# Patient Record
Sex: Male | Born: 1965
Health system: Southern US, Community
[De-identification: ages and names within clinical notes are randomized; demographics above are authoritative.]

## PROBLEM LIST (undated history)

## (undated) DIAGNOSIS — E785 Hyperlipidemia, unspecified: Secondary | ICD-10-CM

## (undated) DIAGNOSIS — N200 Calculus of kidney: Secondary | ICD-10-CM

## (undated) HISTORY — DX: Hyperlipidemia, unspecified: E78.5

## (undated) HISTORY — DX: Calculus of kidney: N20.0

## (undated) HISTORY — PX: CARPAL TUNNEL RELEASE: SHX101

---

## 2003-11-11 ENCOUNTER — Emergency Department (HOSPITAL_COMMUNITY): Admission: EM | Admit: 2003-11-11 | Discharge: 2003-11-12 | Payer: Self-pay | Admitting: Emergency Medicine

## 2003-11-16 ENCOUNTER — Ambulatory Visit (HOSPITAL_BASED_OUTPATIENT_CLINIC_OR_DEPARTMENT_OTHER): Admission: RE | Admit: 2003-11-16 | Discharge: 2003-11-16 | Payer: Self-pay | Admitting: Orthopedic Surgery

## 2003-11-16 ENCOUNTER — Ambulatory Visit (HOSPITAL_COMMUNITY): Admission: RE | Admit: 2003-11-16 | Discharge: 2003-11-16 | Payer: Self-pay | Admitting: Orthopedic Surgery

## 2004-07-23 ENCOUNTER — Ambulatory Visit: Payer: Self-pay | Admitting: Internal Medicine

## 2004-07-28 ENCOUNTER — Ambulatory Visit: Payer: Self-pay | Admitting: Internal Medicine

## 2004-08-06 ENCOUNTER — Ambulatory Visit: Payer: Self-pay | Admitting: Internal Medicine

## 2004-11-07 ENCOUNTER — Ambulatory Visit: Payer: Self-pay | Admitting: Internal Medicine

## 2005-02-21 ENCOUNTER — Emergency Department (HOSPITAL_COMMUNITY): Admission: EM | Admit: 2005-02-21 | Discharge: 2005-02-21 | Payer: Self-pay | Admitting: *Deleted

## 2005-02-27 ENCOUNTER — Ambulatory Visit: Payer: Self-pay | Admitting: Internal Medicine

## 2005-05-25 DIAGNOSIS — N200 Calculus of kidney: Secondary | ICD-10-CM | POA: Insufficient documentation

## 2005-05-25 HISTORY — DX: Calculus of kidney: N20.0

## 2005-09-01 ENCOUNTER — Ambulatory Visit: Payer: Self-pay | Admitting: Internal Medicine

## 2005-09-09 ENCOUNTER — Ambulatory Visit: Payer: Self-pay | Admitting: Internal Medicine

## 2006-07-28 ENCOUNTER — Ambulatory Visit: Payer: Self-pay | Admitting: Internal Medicine

## 2006-07-28 LAB — CONVERTED CEMR LAB
Bilirubin Urine: NEGATIVE
CO2: 30 meq/L (ref 19–32)
Chloride: 106 meq/L (ref 96–112)
Cholesterol: 257 mg/dL (ref 0–200)
Creatinine, Ser: 0.9 mg/dL (ref 0.4–1.5)
Glucose, Bld: 92 mg/dL (ref 70–99)
HDL: 42.3 mg/dL (ref >39.0)
Hemoglobin, Urine: NEGATIVE
Leukocytes, UA: NEGATIVE
Specific Gravity, Urine: 1.025 (ref 1.000–1.03)
Urine Glucose: NEGATIVE mg/dL
pH: 6 (ref 5.0–8.0)

## 2006-12-14 ENCOUNTER — Ambulatory Visit: Payer: Self-pay | Admitting: Internal Medicine

## 2006-12-14 LAB — CONVERTED CEMR LAB
ALT: 29 units/L (ref 0–53)
Cholesterol: 271 mg/dL (ref 0–200)
Direct LDL: 192.4 mg/dL
HDL: 39 mg/dL (ref >39.0)
Total CHOL/HDL Ratio: 6.9
Triglycerides: 72 mg/dL (ref 0–149)

## 2007-02-03 ENCOUNTER — Encounter: Payer: Self-pay | Admitting: Internal Medicine

## 2007-02-03 DIAGNOSIS — E785 Hyperlipidemia, unspecified: Secondary | ICD-10-CM | POA: Insufficient documentation

## 2007-03-15 ENCOUNTER — Encounter: Payer: Self-pay | Admitting: Internal Medicine

## 2007-03-15 ENCOUNTER — Ambulatory Visit: Payer: Self-pay | Admitting: Internal Medicine

## 2007-03-15 DIAGNOSIS — M76899 Other specified enthesopathies of unspecified lower limb, excluding foot: Secondary | ICD-10-CM

## 2007-03-15 HISTORY — DX: Other specified enthesopathies of unspecified lower limb, excluding foot: M76.899

## 2007-10-27 ENCOUNTER — Telehealth: Payer: Self-pay | Admitting: Internal Medicine

## 2007-11-03 ENCOUNTER — Ambulatory Visit: Payer: Self-pay | Admitting: Internal Medicine

## 2007-11-03 LAB — CONVERTED CEMR LAB
ALT: 39 units/L (ref 0–53)
AST: 27 units/L (ref 0–37)
Bilirubin, Direct: 0.1 mg/dL (ref 0.0–0.3)
Calcium: 8.9 mg/dL (ref 8.4–10.5)
Cholesterol: 203 mg/dL (ref 0–200)
Creatinine, Ser: 0.9 mg/dL (ref 0.4–1.5)
GFR calc Af Amer: 118 mL/min
GFR calc non Af Amer: 97 mL/min
HCT: 43.7 % (ref 39.0–52.0)
HDL: 38.4 mg/dL — ABNORMAL LOW (ref >39.0)
Hemoglobin: 15.2 g/dL (ref 13.0–17.0)
Ketones, ur: NEGATIVE mg/dL
Leukocytes, UA: NEGATIVE
MCHC: 34.7 g/dL (ref 30.0–36.0)
Monocytes Absolute: 0.7 10*3/uL (ref 0.1–1.0)
Neutro Abs: 3.3 10*3/uL (ref 1.4–7.7)
Neutrophils Relative %: 48.1 % (ref 43.0–77.0)
Nitrite: NEGATIVE
Potassium: 3.9 meq/L (ref 3.5–5.1)
RDW: 12.2 % (ref 11.5–14.6)
Sodium: 140 meq/L (ref 135–145)
Specific Gravity, Urine: 1.025 (ref 1.000–1.03)
Total Bilirubin: 0.9 mg/dL (ref 0.3–1.2)
Total Protein, Urine: NEGATIVE mg/dL
Total Protein: 7.2 g/dL (ref 6.0–8.3)
Urine Glucose: NEGATIVE mg/dL
Urobilinogen, UA: 0.2 (ref 0.0–1.0)
pH: 6 (ref 5.0–8.0)

## 2007-11-15 ENCOUNTER — Ambulatory Visit: Payer: Self-pay | Admitting: Internal Medicine

## 2008-11-21 ENCOUNTER — Ambulatory Visit: Payer: Self-pay | Admitting: Internal Medicine

## 2008-11-26 LAB — CONVERTED CEMR LAB
Alkaline Phosphatase: 55 units/L (ref 39–117)
BUN: 15 mg/dL (ref 6–23)
Basophils Absolute: 0 10*3/uL (ref 0.0–0.1)
Bilirubin Urine: NEGATIVE
Bilirubin, Direct: 0.1 mg/dL (ref 0.0–0.3)
CO2: 30 meq/L (ref 19–32)
Calcium: 9 mg/dL (ref 8.4–10.5)
Chloride: 106 meq/L (ref 96–112)
Cholesterol: 228 mg/dL — ABNORMAL HIGH (ref 0–200)
Direct LDL: 162.6 mg/dL
GFR calc non Af Amer: 110.98 mL/min (ref >60)
HCT: 43.4 % (ref 39.0–52.0)
Ketones, ur: NEGATIVE mg/dL
Leukocytes, UA: NEGATIVE
Lymphs Abs: 2.6 10*3/uL (ref 0.7–4.0)
MCV: 86.1 fL (ref 78.0–100.0)
Monocytes Absolute: 0.8 10*3/uL (ref 0.1–1.0)
Monocytes Relative: 10.9 % (ref 3.0–12.0)
Neutro Abs: 3.2 10*3/uL (ref 1.4–7.7)
Nitrite: NEGATIVE
RDW: 12.1 % (ref 11.5–14.6)
Specific Gravity, Urine: 1.025 (ref 1.000–1.030)
Total Bilirubin: 1.2 mg/dL (ref 0.3–1.2)
Total Protein, Urine: NEGATIVE mg/dL
Total Protein: 7.1 g/dL (ref 6.0–8.3)
Triglycerides: 116 mg/dL (ref 0.0–149.0)
Urine Glucose: NEGATIVE mg/dL

## 2008-12-10 ENCOUNTER — Ambulatory Visit: Payer: Self-pay | Admitting: Internal Medicine

## 2008-12-10 DIAGNOSIS — Z87448 Personal history of other diseases of urinary system: Secondary | ICD-10-CM | POA: Insufficient documentation

## 2009-01-11 ENCOUNTER — Encounter: Payer: Self-pay | Admitting: Internal Medicine

## 2009-02-21 ENCOUNTER — Encounter: Admission: RE | Admit: 2009-02-21 | Discharge: 2009-02-21 | Payer: Self-pay | Admitting: Internal Medicine

## 2009-10-25 ENCOUNTER — Ambulatory Visit: Payer: Self-pay | Admitting: Internal Medicine

## 2009-10-25 LAB — CONVERTED CEMR LAB
AST: 19 units/L (ref 0–37)
Alkaline Phosphatase: 44 units/L (ref 39–117)
Bilirubin Urine: NEGATIVE
Bilirubin, Direct: 0.1 mg/dL (ref 0.0–0.3)
Chloride: 106 meq/L (ref 96–112)
Cholesterol: 185 mg/dL (ref 0–200)
Eosinophils Relative: 4.3 % (ref 0.0–5.0)
GFR calc non Af Amer: 107.42 mL/min (ref >60)
HCT: 41.9 % (ref 39.0–52.0)
Ketones, ur: NEGATIVE mg/dL
Lymphocytes Relative: 36.3 % (ref 12.0–46.0)
Lymphs Abs: 2.7 10*3/uL (ref 0.7–4.0)
MCHC: 34.3 g/dL (ref 30.0–36.0)
MCV: 85.8 fL (ref 78.0–100.0)
Monocytes Absolute: 0.7 10*3/uL (ref 0.1–1.0)
Neutrophils Relative %: 49.1 % (ref 43.0–77.0)
Nitrite: NEGATIVE
Platelets: 234 10*3/uL (ref 150.0–400.0)
Specific Gravity, Urine: 1.025 (ref 1.000–1.030)
Total Protein, Urine: NEGATIVE mg/dL
VLDL: 22 mg/dL (ref 0.0–40.0)
WBC: 7.4 10*3/uL (ref 4.5–10.5)
pH: 6 (ref 5.0–8.0)

## 2009-10-30 ENCOUNTER — Ambulatory Visit: Payer: Self-pay | Admitting: Internal Medicine

## 2009-10-30 DIAGNOSIS — N410 Acute prostatitis: Secondary | ICD-10-CM

## 2009-10-30 HISTORY — DX: Acute prostatitis: N41.0

## 2009-11-19 ENCOUNTER — Telehealth: Payer: Self-pay | Admitting: Internal Medicine

## 2010-05-28 ENCOUNTER — Telehealth: Payer: Self-pay | Admitting: Internal Medicine

## 2010-06-24 NOTE — Progress Notes (Signed)
Summary: Plot pt  Phone Note Call from Patient Call back at [facility] Phone [phone]   Summary of Call: Pt completed meds given at last office visit. He feels somewhat better but continues to have symptoms. Please adivse.  Initial call taken by: Lamar Sprinkles, CMA,  November 19, 2009 5:34 PM  Follow-up for Phone Call        get him to come in for a recheck Follow-up by: Etta Grandchild MD,  November 20, 2009 7:30 AM     Appended Document: Plot pt left detailed vm on hm # for pt to contact office for f/u

## 2010-06-24 NOTE — Assessment & Plan Note (Signed)
Summary: CPX/ /NWS  #   Vital Signs:  Patient profile:   45 year old male Height:      71 inches Weight:      210 pounds BMI:     29.39 O2 Sat:      98 % on Room air Temp:     98.1 degrees F oral Pulse rate:   72 / minute BP sitting:   112 / 70  (left arm) Cuff size:   regular  Vitals Entered By: Lucious Groves (October 30, 2009 10:45 AM)  O2 Flow:  Room air CC: CPX./kb Is Patient Diabetic? No Pain Assessment Patient in pain? no        CC:  CPX./kb.  History of Present Illness: The patient presents for a wellness examination  C/o perineal pain, chills, urgency, fatigue x 2 wks   Current Medications (verified): 1)  Vitamin D3 1000 Unit  Tabs (Cholecalciferol) .Marland Kitchen.. 1 By Mouth Daily 2)  Lipitor 40 Mg Tabs (Atorvastatin Calcium) .Marland Kitchen.. 1 By Mouth Qd  Allergies (verified): 1)  ! Simvastatin (Simvastatin)  Past History:  Past Medical History: Last updated: 12/10/2008 DYSLIPIDEMIA (ICD-272.4) Kidney stones 2007, h/o urol. consult Microhematuria  Social History: Last updated: 11/15/2007 Never Smoked Alcohol use - a little Occupation: Married Regular exercise-yes  Review of Systems       The patient complains of fever.  The patient denies anorexia, weight loss, weight gain, vision loss, decreased hearing, hoarseness, chest pain, syncope, dyspnea on exertion, peripheral edema, prolonged cough, headaches, hemoptysis, abdominal pain, melena, hematochezia, severe indigestion/heartburn, hematuria, incontinence, genital sores, muscle weakness, suspicious skin lesions, transient blindness, difficulty walking, depression, unusual weight change, abnormal bleeding, enlarged lymph nodes, angioedema, and testicular masses.    Physical Exam  General:  Well-developed,well-nourished,in no acute distress; alert,appropriate and cooperative throughout examination Head:  Normocephalic and atraumatic without obvious abnormalities. No apparent alopecia or balding. Eyes:  No corneal or  conjunctival inflammation noted. EOMI. Perrla. Ears:  External ear exam shows no significant lesions or deformities.  Otoscopic examination reveals clear canals, tympanic membranes are intact bilaterally without bulging, retraction, inflammation or discharge. Hearing is grossly normal bilaterally. Nose:  External nasal examination shows no deformity or inflammation. Nasal mucosa are pink and moist without lesions or exudates. Mouth:  Oral mucosa and oropharynx without lesions or exudates.  Teeth in good repair. Neck:  No deformities, masses, or tenderness noted. Lungs:  Normal respiratory effort, chest expands symmetrically. Lungs are clear to auscultation, no crackles or wheezes. Heart:  Normal rate and regular rhythm. S1 and S2 normal without gallop, murmur, click, rub or other extra sounds. Abdomen:  Bowel sounds positive,abdomen soft and non-tender without masses, organomegaly or hernias noted. Rectal:  No external abnormalities noted. Normal sphincter tone. No rectal masses or tenderness. Genitalia:  Testes bilaterally descended without nodularity, tenderness or masses. No scrotal masses or lesions. No penis lesions or urethral discharge. Prostate:  Prostate gland firm and smooth, no enlargement, nodularity, tenderness, mass, asymmetry or induration. Msk:  No deformity or scoliosis noted of thoracic or lumbar spine.   Pulses:  R and L carotid,radial,femoral,dorsalis pedis and posterior tibial pulses are full and equal bilaterally Extremities:  No clubbing, cyanosis, edema, or deformity noted with normal full range of motion of all joints.   Neurologic:  No cranial nerve deficits noted. Station and gait are normal. Plantar reflexes are down-going bilaterally. DTRs are symmetrical throughout. Sensory, motor and coordinative functions appear intact. Skin:  Intact without suspicious lesions or rashes Cervical Nodes:  No lymphadenopathy noted Inguinal Nodes:  No significant adenopathy Psych:   Cognition and judgment appear intact. Alert and cooperative with normal attention span and concentration. No apparent delusions, illusions, hallucinations   Impression & Recommendations:  Problem # 1:  WELL ADULT EXAM (ICD-V70.0) Assessment New The labs were reviewed with the patient.  Health and age related issues were discussed. Available screening tests and vaccinations were discussed as well. Healthy life style including good diet and execise was discussed.  Orders: EKG w/ Interpretation (93000)  Problem # 2:  PROSTATITIS, ACUTE (ICD-601.0) Assessment: New Cipro x 2 wks  Problem # 3:  DYSLIPIDEMIA (ICD-272.4) Assessment: Improved  His updated medication list for this problem includes:    Lipitor 40 Mg Tabs (Atorvastatin calcium) .Marland Kitchen... 1 by mouth qd  Complete Medication List: 1)  Vitamin D3 1000 Unit Tabs (Cholecalciferol) .Marland Kitchen.. 1 by mouth daily 2)  Lipitor 40 Mg Tabs (Atorvastatin calcium) .Marland Kitchen.. 1 by mouth qd 3)  Ciprofloxacin Hcl 500 Mg Tabs (Ciprofloxacin hcl) .Marland Kitchen.. 1 by mouth bid 4)  Naproxen 500 Mg Tabs (Naproxen) .Marland Kitchen.. 1 by mouth two times a day pc x 1 wk then prn  Patient Instructions: 1)  Please schedule a follow-up appointment in 6 months. 2)  BMP prior to visit, ICD-9: 3)  Lipid Panel prior to visit, ICD-9:272.20 4)  Urine Microalbumin prior to visit, ICD-9: 5)  Rapaflo if "stopped up" as needed for  a  car trip to [place] Prescriptions: LIPITOR 40 MG TABS (ATORVASTATIN CALCIUM) 1 by mouth qd  #30 x 12   Entered and Authorized by:   Tresa Garter MD   Signed by:   Tresa Garter MD on 10/30/2009   Method used:   Electronically to        [facility] [phone]* (retail)       [address]       [place], [place]  [address]       Ph: [phone]       Fax: [phone]   RxID:   [id number] NAPROXEN 500 MG TABS (NAPROXEN) 1 by mouth two times a day pc x 1 wk then prn  #60 x 1   Entered and Authorized by:   Tresa Garter MD   Signed by:   Tresa Garter MD on 10/30/2009   Method used:   Electronically to        [facility]. [phone]* (retail)       [address]       [place], [place]  [address]       Ph: [phone]       Fax: [phone]   RxID:   [id number] CIPROFLOXACIN HCL 500 MG TABS (CIPROFLOXACIN HCL) 1 by mouth bid  #28 x 1   Entered and Authorized by:   Tresa Garter MD   Signed by:   Tresa Garter MD on 10/30/2009   Method used:   Electronically to        [facility]. [phone]* (retail)       [address]       [place], [place]  [address]       Ph: [phone]       Fax: [phone]   RxID:   [id number]

## 2010-06-26 NOTE — Progress Notes (Signed)
Summary: LIPITOR NOT COVERED  Phone Note Refill Request   Refills Requested: Medication #1:  LIPITOR 40 MG TABS 1 by mouth qd Pts insurance will not pay for lipitor, please advise another alternative.   Initial call taken by: Ami Bullins CMA,  May 28, 2010 3:55 PM  Follow-up for Phone Call        Lipitor is generic now. What is covered? Follow-up by: Tresa Garter MD,  May 29, 2010 12:04 AM  Additional Follow-up for Phone Call Additional follow up Details #1::        Per pharmacy pt last picked up Rx in June 2011 and paid $162. Lipitor is now generic and with Insurance coverage it is $102. I called pt to get more details, no ans no VM. Margaret Pyle, CMA  May 30, 2010 11:03 AM     Additional Follow-up for Phone Call Additional follow up Details #2::    Pt called this am and left message on triage A and would like a call today regarding above message -- [phone] or [phone] Follow-up by: Verdell Face,  June 02, 2010 9:47 AM  Additional Follow-up for Phone Call Additional follow up Details #3:: Details for Additional Follow-up Action Taken: Left detailed vm for pt, need to know what plan covers. Pt has been on simvastatin in past but it is on allergy list..............Marland KitchenLamar Sprinkles, CMA  June 03, 2010 6:14 PM

## 2010-10-10 NOTE — Op Note (Signed)
Adrian Cruz, Adrian Cruz                      ACCOUNT NO.:  [id number]   MEDICAL RECORD NO.:  [id number]                   PATIENT TYPE:  AMB   LOCATION:  [facility]                                  FACILITY:  [facility]   PHYSICIAN:  Katy Fitch. Naaman Plummer., M.D.          DATE OF BIRTH:  Sep 25, 1965   DATE OF PROCEDURE:  11/16/2003  DATE OF DISCHARGE:  11/16/2003                                 OPERATIVE REPORT   PREOPERATIVE DIAGNOSIS:  Comminuted intraarticular fracture right distal  radius with impaction, dorsal angulation and loss or radial sloop and  length.   POSTOPERATIVE DIAGNOSIS:  Comminuted intraarticular fracture right distal  radius with impaction, dorsal angulation and loss or radial sloop and  length.   OPERATION:  Open reduction, internal fixation with a 9 peg/ screw, DVR plate  system.   OPERATING SURGEON:  Katy Fitch. Sypher, M.D.   ASSISTANT:  Marveen Reeks. Dasnoit, P.A.-C.   ANESTHESIA:  General by LMA.   SUPERVISING ANESTHESIOLOGIST:  Judie Petit, M.D.   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   On November 11, 2003, he fell in his garage sustaining a severe injury to his  right wrist.  He was seen at an Urgent Care Facility where x-rays revealed a  comminuted intraarticular fracture of the right wrist. He was splinted and  referred for upper-extremity orthopedic evaluation and management.  Clinical  examination in the office revealed evidence of a severely-comminuted,  intraarticular fracture of the right distal radius.  It was impacted, with  loss of radial volar tilt, length and slope.   We recommended open reduction, internal fixation at this time, anticipating  placement of a volar plate system.   After informed consent, Adrian Cruz was brought to the operating room at  this time.   DESCRIPTION OF PROCEDURE:  Adrian Cruz is brought to the operating  room and placed  in supine position on the operating table.  Following  induction of general anesthesia by LMA, the right arm was prepped with  Betadine solution, and sterilely draped.  Ancef 1 gram was administered as  an IV prophylactic antibiotic.  The procedure commenced with exsanguination  of the right arm with an Esmarch bandage and inflation of arterial target on  the proximal brachium to 250 mmHg, and later 270 mmHg due to mild systolic  hypertension.   The procedure commenced with a standard DVR curvilinear incision paralleling  the flexor carpi radialis.  Subcutaneous tissue was carefully divided and  carried taking care to identify and cauterize transverse veins.  A large-  caliber vein crossing the incision was suture-ligated with 4-0 Vicryl.   The fascia overlying the flexor carpi radialis was released, followed by  release of the fascia at the floor of the flexor carpi radialis sheath.  The  flexor pollicis longus was retracted in an ulnar direction, and the radial  artery and its  accompanying veins in a radial direction.  The pronator  quadratus was carefully elevated off the distal radius, preserving its  origin on the ulna.   The fracture site was identified and found to be significantly impacted and  comminuted.  With the aid of  Kleinert-Kutz elevator, the fracture fragments  were repositioned anatomically.  The volar cortex was anatomically  assembled, followed by application of a 9 Peg DVR plate system.  Multiple  screws were used in the proximal row to secure the metaphysis of the distal  fragments.  Pegs were used on the radial and ulnar surfaces, and in  positions likely to possibly encounter a tendon on the dorsal cortex.  Care  was taken to drill the most distal pegs to the dorsal cortex but not through  the distal cortex.   The wound was then thoroughly debouched with sterile saline, and AP lateral  and DVR oblique images obtained documenting anatomic reduction of the  radius  and excellent position of the hardware.   The wound was then lavaged with sterile saline, followed by repair of the  pronator quadratus to cover the plate, particularly distally.  Mattress  sutures of 0-Vicryl were utilized to secure the pronator quadratus over the  distal two-thirds of the plate.  The skin was then repaired with subdermal  sutures of 3-0 Vicryl and intradermal 3-0 Prolene with Steri-Strips.  There  were no apparent complications.  Mr.  Cruz tolerated the surgery and  anesthesia well.  He was discharged to the recovery room with stable vital  signs.   NOTE FOR AFTER CARE:  He was given prescription for:  1. Dilaudid 2 mg, one to two tablets, p.o. q.4-6h. p.r.n. pain, #30 tablets     without refill.  2. Motrin 600 mg, one p.o. q.6h p.r.n. pain, #30 tablets without refill.  3. Keflex 500 mg, one p.o. q.8h. x4 days as a prophylactic antibiotic.                                               Katy Fitch Naaman Plummer., M.D.    RVS/MEDQ  D:  11/16/2003  T:  11/18/2003  Job:  [id number]

## 2010-12-30 ENCOUNTER — Other Ambulatory Visit: Payer: Self-pay | Admitting: Internal Medicine

## 2010-12-30 ENCOUNTER — Other Ambulatory Visit (INDEPENDENT_AMBULATORY_CARE_PROVIDER_SITE_OTHER): Payer: Self-pay

## 2010-12-30 DIAGNOSIS — Z0000 Encounter for general adult medical examination without abnormal findings: Secondary | ICD-10-CM

## 2010-12-30 LAB — CBC WITH DIFFERENTIAL/PLATELET
Basophils Relative: 0.6 % (ref 0.0–3.0)
Eosinophils Relative: 7.3 % — ABNORMAL HIGH (ref 0.0–5.0)
HCT: 43.3 % (ref 39.0–52.0)
Hemoglobin: 14.4 g/dL (ref 13.0–17.0)
Lymphocytes Relative: 43.7 % (ref 12.0–46.0)
MCHC: 33.4 g/dL (ref 30.0–36.0)
Neutrophils Relative %: 38.5 % — ABNORMAL LOW (ref 43.0–77.0)
RBC: 5.01 Mil/uL (ref 4.22–5.81)

## 2010-12-30 LAB — LIPID PANEL
Cholesterol: 228 mg/dL — ABNORMAL HIGH (ref 0–200)
HDL: 43.8 mg/dL (ref >39.00)
Triglycerides: 167 mg/dL — ABNORMAL HIGH (ref 0.0–149.0)

## 2010-12-30 LAB — URINALYSIS
Bilirubin Urine: NEGATIVE
Hgb urine dipstick: NEGATIVE
Leukocytes, UA: NEGATIVE
Nitrite: NEGATIVE
Total Protein, Urine: NEGATIVE

## 2010-12-30 LAB — HEPATIC FUNCTION PANEL
Alkaline Phosphatase: 48 U/L (ref 39–117)
Bilirubin, Direct: 0.1 mg/dL (ref 0.0–0.3)

## 2010-12-30 LAB — BASIC METABOLIC PANEL
CO2: 28 mEq/L (ref 19–32)
Calcium: 9 mg/dL (ref 8.4–10.5)
Creatinine, Ser: 0.8 mg/dL (ref 0.4–1.5)
GFR: 113.22 mL/min (ref >60.00)
Glucose, Bld: 97 mg/dL (ref 70–99)

## 2010-12-30 LAB — LDL CHOLESTEROL, DIRECT: Direct LDL: 141 mg/dL

## 2010-12-31 ENCOUNTER — Other Ambulatory Visit: Payer: Self-pay | Admitting: Internal Medicine

## 2010-12-31 DIAGNOSIS — Z0000 Encounter for general adult medical examination without abnormal findings: Secondary | ICD-10-CM

## 2011-01-06 ENCOUNTER — Encounter: Payer: Self-pay | Admitting: Internal Medicine

## 2011-01-06 ENCOUNTER — Ambulatory Visit (INDEPENDENT_AMBULATORY_CARE_PROVIDER_SITE_OTHER): Payer: BC Managed Care – PPO | Admitting: Internal Medicine

## 2011-01-06 VITALS — BP 110/70 | HR 80 | Temp 98.4°F | Resp 16 | Ht 71.0 in | Wt 208.0 lb

## 2011-01-06 DIAGNOSIS — Z0000 Encounter for general adult medical examination without abnormal findings: Secondary | ICD-10-CM

## 2011-01-06 DIAGNOSIS — E785 Hyperlipidemia, unspecified: Secondary | ICD-10-CM

## 2011-01-06 HISTORY — DX: Encounter for general adult medical examination without abnormal findings: Z00.00

## 2011-01-06 MED ORDER — ATORVASTATIN CALCIUM 40 MG PO TABS: 40.0000 mg | ORAL_TABLET | Freq: Every day | ORAL | Status: DC

## 2011-01-06 NOTE — Patient Instructions (Signed)
tDAP next year

## 2011-01-06 NOTE — Assessment & Plan Note (Signed)
On Rx - does not want to change dose

## 2011-01-06 NOTE — Progress Notes (Signed)
  Subjective:    Patient ID: Adrian Cruz, male    DOB: Jan 17, 1966, 45 y.o.   MRN: [id number]  HPI  The patient is here for a wellness exam. The patient has been doing well overall without major physical or psychological issues going on lately.  The patient needs to address to address chronic  hyperlipidemia controlled with medicines as well;controlled with medical treatment and diet.   Review of Systems  Constitutional: Negative for appetite change, fatigue and unexpected weight change.  HENT: Negative for nosebleeds, congestion, sore throat, sneezing, trouble swallowing and neck pain.   Eyes: Negative for itching and visual disturbance.  Respiratory: Negative for cough.   Cardiovascular: Negative for chest pain, palpitations and leg swelling.  Gastrointestinal: Negative for nausea, diarrhea, blood in stool and abdominal distention.  Genitourinary: Negative for frequency and hematuria.  Musculoskeletal: Negative for back pain, joint swelling and gait problem.  Skin: Negative for rash.  Neurological: Negative for dizziness, tremors, speech difficulty and weakness.  Psychiatric/Behavioral: Negative for sleep disturbance, dysphoric mood and agitation. The patient is not nervous/anxious.    Wt Readings from Last 3 Encounters:  01/06/11 208 lb (94.348 kg)  10/30/09 210 lb (95.255 kg)  12/10/08 209 lb (94.802 kg)        Objective:   Physical Exam  Constitutional: He is oriented to person, place, and time. He appears well-developed and well-nourished. No distress.  HENT:  Head: Normocephalic and atraumatic.  Right Ear: External ear normal.  Left Ear: External ear normal.  Nose: Nose normal.  Mouth/Throat: Oropharynx is clear and moist. No oropharyngeal exudate.  Eyes: Conjunctivae and EOM are normal. Pupils are equal, round, and reactive to light. Right eye exhibits no discharge. Left eye exhibits no discharge. No scleral icterus.  Neck: Normal range of motion. Neck supple. No  JVD present. No tracheal deviation present. No thyromegaly present.  Cardiovascular: Normal rate, regular rhythm, normal heart sounds and intact distal pulses.  Exam reveals no gallop and no friction rub.   No murmur heard. Pulmonary/Chest: Effort normal and breath sounds normal. No stridor. No respiratory distress. He has no wheezes. He has no rales. He exhibits no tenderness.  Abdominal: Soft. Bowel sounds are normal. He exhibits no distension and no mass. There is no tenderness. There is no rebound and no guarding.  Genitourinary: Penis normal.  Musculoskeletal: Normal range of motion. He exhibits no edema and no tenderness.  Lymphadenopathy:    He has no cervical adenopathy.  Neurological: He is alert and oriented to person, place, and time. He has normal reflexes. No cranial nerve deficit. He exhibits normal muscle tone. Coordination normal.  Skin: Skin is warm and dry. No rash noted. He is not diaphoretic. No erythema. No pallor.  Psychiatric: He has a normal mood and affect. His behavior is normal. Judgment and thought content normal.    Lab Results  Component Value Date   WBC 8.0 12/30/2010   HGB 14.4 12/30/2010   HCT 43.3 12/30/2010   PLT 279.0 12/30/2010   CHOL 228* 12/30/2010   TRIG 167.0* 12/30/2010   HDL 43.80 12/30/2010   LDLDIRECT 141.0 12/30/2010   ALT 33 12/30/2010   AST 24 12/30/2010   NA 141 12/30/2010   K 4.4 12/30/2010   CL 105 12/30/2010   CREATININE 0.8 12/30/2010   BUN 13 12/30/2010   CO2 28 12/30/2010   TSH 3.82 12/30/2010   PSA 0.85 10/25/2009         Assessment & Plan:

## 2011-05-06 ENCOUNTER — Telehealth: Payer: Self-pay | Admitting: Internal Medicine

## 2011-05-06 NOTE — Telephone Encounter (Signed)
The pt called and stated he was having muscle pain and joint pain which he believes is coming from the Lipitor.  The pt is hoping for some advice on this issue.   Thanks!

## 2011-05-06 NOTE — Telephone Encounter (Signed)
Stop Lipitor When the pain is gone - re-try Lipitor (in 3-4 wks) - stop if pain relapsed Advil prn Vit D 100 iu/d Thx

## 2011-05-07 NOTE — Telephone Encounter (Signed)
Left mess for patient to call back.  

## 2011-05-08 NOTE — Telephone Encounter (Signed)
Pt informed

## 2011-10-05 ENCOUNTER — Telehealth: Payer: Self-pay | Admitting: *Deleted

## 2011-10-05 DIAGNOSIS — Z125 Encounter for screening for malignant neoplasm of prostate: Secondary | ICD-10-CM

## 2011-10-05 DIAGNOSIS — Z0000 Encounter for general adult medical examination without abnormal findings: Secondary | ICD-10-CM

## 2011-10-05 NOTE — Telephone Encounter (Signed)
Message copied by Merrilyn Puma on Mon Oct 05, 2011  8:44 AM ------      Message from: COUSIN, SHARON T      Created: Tue Sep 15, 2011  4:53 PM      Regarding: PHY DATE:  01/11/12       THANKS

## 2011-10-05 NOTE — Telephone Encounter (Signed)
01/11/12 CPE labs entered.

## 2011-12-11 ENCOUNTER — Encounter: Payer: Self-pay | Admitting: Internal Medicine

## 2011-12-11 ENCOUNTER — Ambulatory Visit (INDEPENDENT_AMBULATORY_CARE_PROVIDER_SITE_OTHER): Payer: BC Managed Care – PPO | Admitting: Internal Medicine

## 2011-12-11 ENCOUNTER — Telehealth: Payer: Self-pay | Admitting: Internal Medicine

## 2011-12-11 VITALS — BP 130/88 | HR 84 | Temp 98.3°F | Resp 16 | Wt 208.0 lb

## 2011-12-11 DIAGNOSIS — N410 Acute prostatitis: Secondary | ICD-10-CM

## 2011-12-11 DIAGNOSIS — M791 Myalgia, unspecified site: Secondary | ICD-10-CM

## 2011-12-11 DIAGNOSIS — E785 Hyperlipidemia, unspecified: Secondary | ICD-10-CM

## 2011-12-11 DIAGNOSIS — IMO0001 Reserved for inherently not codable concepts without codable children: Secondary | ICD-10-CM

## 2011-12-11 HISTORY — DX: Myalgia, unspecified site: M79.10

## 2011-12-11 MED ORDER — CIPROFLOXACIN HCL 500 MG PO TABS: 500.0000 mg | ORAL_TABLET | Freq: Two times a day (BID) | ORAL | Status: AC

## 2011-12-11 NOTE — Assessment & Plan Note (Signed)
Start cipro x 2 wks

## 2011-12-11 NOTE — Telephone Encounter (Signed)
Work in at [employer]

## 2011-12-11 NOTE — Telephone Encounter (Signed)
Caller: Adrian Cruz/Patient; PCP: Sonda Primes; CB#: [phone]; Call regarding Prostate Problems; sx started 1 week ago; approx 12/04/11;   urine flow is slow and having pain constantly in the both  testicles; Triaged per Urinary Symptoms-Male Guideline; See in 24 hr d/t urinary tract symptoms and scrotal pain; denies swelling of testicles;  pt is refusing to see any other physician besides Dr Posey Rea; office called and instructed to send message and will have to get approval from MD to work in; explained to pt that message will be sent to MD and will call pt back with further instructions; will comply; OFFICE PELASE REVIEW AND CALL PT BACK ON CELL NUMBER [phone]

## 2011-12-11 NOTE — Progress Notes (Signed)
Subjective:    Patient ID: Adrian Cruz, male    DOB: 10-10-1965, 46 y.o.   MRN: [id number]  HPI  C/o prostate pain, testic pain and urinary difficulties x 3 wks, like in 2011  The patient needs to address to address chronic  hyperlipidemia - he stopped Lipitor due to muscle pains  Review of Systems  Constitutional: Negative for appetite change, fatigue and unexpected weight change.  HENT: Negative for nosebleeds, congestion, sneezing, trouble swallowing and neck pain.   Eyes: Negative for itching and visual disturbance.  Cardiovascular: Negative for palpitations and leg swelling.  Gastrointestinal: Negative for blood in stool and abdominal distention.  Genitourinary: Positive for urgency, difficulty urinating and testicular pain. Negative for dysuria, frequency, hematuria, flank pain, decreased urine volume, discharge, genital sores and penile pain.  Musculoskeletal: Negative for back pain, joint swelling and gait problem.  Neurological: Negative for dizziness, tremors, speech difficulty and weakness.  Psychiatric/Behavioral: Negative for disturbed wake/sleep cycle, dysphoric mood and agitation. The patient is not nervous/anxious.    Wt Readings from Last 3 Encounters:  12/11/11 208 lb (94.348 kg)  01/06/11 208 lb (94.348 kg)  10/30/09 210 lb (95.255 kg)        Objective:   Physical Exam  Constitutional: He is oriented to person, place, and time. He appears well-developed and well-nourished. No distress.  HENT:  Head: Normocephalic and atraumatic.  Right Ear: External ear normal.  Left Ear: External ear normal.  Nose: Nose normal.  Mouth/Throat: Oropharynx is clear and moist. No oropharyngeal exudate.  Eyes: Conjunctivae and EOM are normal. Pupils are equal, round, and reactive to light. Right eye exhibits no discharge. Left eye exhibits no discharge. No scleral icterus.  Neck: Normal range of motion. Neck supple. No JVD present. No tracheal deviation present. No  thyromegaly present.  Cardiovascular: Normal rate, regular rhythm, normal heart sounds and intact distal pulses.  Exam reveals no gallop and no friction rub.   No murmur heard. Pulmonary/Chest: Effort normal and breath sounds normal. No stridor. No respiratory distress. He has no wheezes. He has no rales. He exhibits no tenderness.  Abdominal: Soft. Bowel sounds are normal. He exhibits no distension and no mass. There is no tenderness. There is no rebound and no guarding.  Genitourinary: Guaiac negative stool. No penile tenderness.       Prostate is sensitive; size is nl  Musculoskeletal: Normal range of motion. He exhibits no edema and no tenderness.  Lymphadenopathy:    He has no cervical adenopathy.  Neurological: He is alert and oriented to person, place, and time. He has normal reflexes. No cranial nerve deficit. He exhibits normal muscle tone. Coordination normal.  Skin: Skin is warm and dry. No rash noted. He is not diaphoretic. No erythema. No pallor.  Psychiatric: He has a normal mood and affect. His behavior is normal. Judgment and thought content normal.    Lab Results  Component Value Date   WBC 8.0 12/30/2010   HGB 14.4 12/30/2010   HCT 43.3 12/30/2010   PLT 279.0 12/30/2010   CHOL 228* 12/30/2010   TRIG 167.0* 12/30/2010   HDL 43.80 12/30/2010   LDLDIRECT 141.0 12/30/2010   ALT 33 12/30/2010   AST 24 12/30/2010   NA 141 12/30/2010   K 4.4 12/30/2010   CL 105 12/30/2010   CREATININE 0.8 12/30/2010   BUN 13 12/30/2010   CO2 28 12/30/2010   TSH 3.82 12/30/2010   PSA 0.85 10/25/2009         Assessment &  Plan:

## 2011-12-11 NOTE — Assessment & Plan Note (Signed)
Will re-check lipids May try a different Rx

## 2011-12-11 NOTE — Assessment & Plan Note (Signed)
D/c lipitor 

## 2011-12-16 ENCOUNTER — Ambulatory Visit: Payer: BC Managed Care – PPO | Admitting: Internal Medicine

## 2011-12-28 ENCOUNTER — Telehealth: Payer: Self-pay

## 2011-12-28 DIAGNOSIS — N419 Inflammatory disease of prostate, unspecified: Secondary | ICD-10-CM

## 2011-12-28 NOTE — Telephone Encounter (Signed)
Pt called stating he completed ABX course for prostatitis but he is continuing to have same sxs. Pt is requesting advisement from AVP.

## 2011-12-29 MED ORDER — SILODOSIN 8 MG PO CAPS: 8.0000 mg | ORAL_CAPSULE | Freq: Every day | ORAL | Status: DC

## 2011-12-29 MED ORDER — NAPROXEN 500 MG PO TABS: 500.0000 mg | ORAL_TABLET | Freq: Two times a day (BID) | ORAL | Status: DC

## 2011-12-29 NOTE — Telephone Encounter (Signed)
Cont Naprosyn Start Rapaflo 1 a day - emailed Urol ref Thx

## 2011-12-29 NOTE — Telephone Encounter (Signed)
Pt informed

## 2012-01-11 ENCOUNTER — Encounter: Payer: BC Managed Care – PPO | Admitting: Internal Medicine

## 2012-02-19 ENCOUNTER — Other Ambulatory Visit (INDEPENDENT_AMBULATORY_CARE_PROVIDER_SITE_OTHER): Payer: BC Managed Care – PPO

## 2012-02-19 DIAGNOSIS — Z125 Encounter for screening for malignant neoplasm of prostate: Secondary | ICD-10-CM

## 2012-02-19 DIAGNOSIS — Z0000 Encounter for general adult medical examination without abnormal findings: Secondary | ICD-10-CM

## 2012-02-19 LAB — LIPID PANEL: Total CHOL/HDL Ratio: 7

## 2012-02-19 LAB — URINALYSIS, ROUTINE W REFLEX MICROSCOPIC
Bilirubin Urine: NEGATIVE
Hgb urine dipstick: NEGATIVE
Ketones, ur: NEGATIVE
Leukocytes, UA: NEGATIVE
Nitrite: NEGATIVE
Urobilinogen, UA: 0.2 (ref 0.0–1.0)

## 2012-02-19 LAB — CBC WITH DIFFERENTIAL/PLATELET
Basophils Absolute: 0 10*3/uL (ref 0.0–0.1)
Eosinophils Relative: 5.5 % — ABNORMAL HIGH (ref 0.0–5.0)
HCT: 42.6 % (ref 39.0–52.0)
Lymphocytes Relative: 36.8 % (ref 12.0–46.0)
Lymphs Abs: 2.7 10*3/uL (ref 0.7–4.0)
MCHC: 32.8 g/dL (ref 30.0–36.0)
MCV: 86.5 fl (ref 78.0–100.0)
Monocytes Absolute: 0.7 10*3/uL (ref 0.1–1.0)
Monocytes Relative: 9.5 % (ref 3.0–12.0)
Platelets: 256 10*3/uL (ref 150.0–400.0)
RDW: 13.1 % (ref 11.5–14.6)

## 2012-02-19 LAB — HEPATIC FUNCTION PANEL
AST: 21 U/L (ref 0–37)
Alkaline Phosphatase: 48 U/L (ref 39–117)
Bilirubin, Direct: 0.1 mg/dL (ref 0.0–0.3)

## 2012-02-19 LAB — BASIC METABOLIC PANEL
BUN: 16 mg/dL (ref 6–23)
Creatinine, Ser: 0.9 mg/dL (ref 0.4–1.5)
Potassium: 4.1 mEq/L (ref 3.5–5.1)
Sodium: 140 mEq/L (ref 135–145)

## 2012-02-24 ENCOUNTER — Ambulatory Visit (INDEPENDENT_AMBULATORY_CARE_PROVIDER_SITE_OTHER): Payer: BC Managed Care – PPO | Admitting: Internal Medicine

## 2012-02-24 ENCOUNTER — Encounter: Payer: Self-pay | Admitting: Internal Medicine

## 2012-02-24 VITALS — BP 128/80 | HR 76 | Temp 97.8°F | Resp 16 | Ht 70.0 in | Wt 211.0 lb

## 2012-02-24 DIAGNOSIS — Z0000 Encounter for general adult medical examination without abnormal findings: Secondary | ICD-10-CM

## 2012-02-24 DIAGNOSIS — Z23 Encounter for immunization: Secondary | ICD-10-CM

## 2012-02-24 DIAGNOSIS — N410 Acute prostatitis: Secondary | ICD-10-CM

## 2012-02-24 MED ORDER — GABAPENTIN 100 MG PO CAPS: 100.0000 mg | ORAL_CAPSULE | Freq: Three times a day (TID) | ORAL | Status: DC | PRN

## 2012-02-24 MED ORDER — PRAVASTATIN SODIUM 20 MG PO TABS: 20.0000 mg | ORAL_TABLET | Freq: Every day | ORAL | Status: DC

## 2012-02-24 MED ORDER — ALFUZOSIN HCL ER 10 MG PO TB24: 10.0000 mg | ORAL_TABLET | Freq: Every day | ORAL | Status: DC

## 2012-02-24 MED ORDER — DOXYCYCLINE HYCLATE 100 MG PO TABS: 100.0000 mg | ORAL_TABLET | Freq: Two times a day (BID) | ORAL | Status: DC

## 2012-02-24 NOTE — Patient Instructions (Signed)
Cranberry tablets for infection prophylaxis

## 2012-02-24 NOTE — Assessment & Plan Note (Signed)
We discussed age appropriate health related issues, including available/recomended screening tests and vaccinations. We discussed a need for adhering to healthy diet and exercise. Labs/EKG were reviewed/ordered. All questions were answered.   

## 2012-02-24 NOTE — Progress Notes (Signed)
Subjective:    Patient ID: Adrian Cruz, male    DOB: 11/29/1965, 46 y.o.   MRN: [id number]  HPI  The patient is here for a wellness exam. The patient has been doing well overall without major physical or psychological issues going on lately.   C/o prostate pain, testic pain and urinary difficulties x 3 wks, like in 2011  The patient needs to address to address chronic  hyperlipidemia - he stopped Lipitor due to muscle pains  Review of Systems  Constitutional: Negative for appetite change, fatigue and unexpected weight change.  HENT: Negative for nosebleeds, congestion, sneezing, trouble swallowing and neck pain.   Eyes: Negative for itching and visual disturbance.  Cardiovascular: Negative for palpitations and leg swelling.  Gastrointestinal: Negative for blood in stool and abdominal distention.  Genitourinary: Positive for urgency, difficulty urinating and testicular pain. Negative for dysuria, frequency, hematuria, flank pain, decreased urine volume, discharge, genital sores and penile pain.  Musculoskeletal: Negative for back pain, joint swelling and gait problem.  Neurological: Negative for dizziness, tremors, speech difficulty and weakness.  Psychiatric/Behavioral: Negative for disturbed wake/sleep cycle, dysphoric mood and agitation. The patient is not nervous/anxious.    Wt Readings from Last 3 Encounters:  02/24/12 211 lb (95.709 kg)  12/11/11 208 lb (94.348 kg)  01/06/11 208 lb (94.348 kg)        Objective:   Physical Exam  Constitutional: He is oriented to person, place, and time. He appears well-developed and well-nourished. No distress.  HENT:  Head: Normocephalic and atraumatic.  Right Ear: External ear normal.  Left Ear: External ear normal.  Nose: Nose normal.  Mouth/Throat: Oropharynx is clear and moist. No oropharyngeal exudate.  Eyes: Conjunctivae normal and EOM are normal. Pupils are equal, round, and reactive to light. Right eye exhibits no  discharge. Left eye exhibits no discharge. No scleral icterus.  Neck: Normal range of motion. Neck supple. No JVD present. No tracheal deviation present. No thyromegaly present.  Cardiovascular: Normal rate, regular rhythm, normal heart sounds and intact distal pulses.  Exam reveals no gallop and no friction rub.   No murmur heard. Pulmonary/Chest: Effort normal and breath sounds normal. No stridor. No respiratory distress. He has no wheezes. He has no rales. He exhibits no tenderness.  Abdominal: Soft. Bowel sounds are normal. He exhibits no distension and no mass. There is no tenderness. There is no rebound and no guarding.  Genitourinary: Guaiac negative stool. No penile tenderness.       Prostate is sensitive; size is nl  Musculoskeletal: Normal range of motion. He exhibits no edema and no tenderness.  Lymphadenopathy:    He has no cervical adenopathy.  Neurological: He is alert and oriented to person, place, and time. He has normal reflexes. No cranial nerve deficit. He exhibits normal muscle tone. Coordination normal.  Skin: Skin is warm and dry. No rash noted. He is not diaphoretic. No erythema. No pallor.  Psychiatric: He has a normal mood and affect. His behavior is normal. Judgment and thought content normal.    Lab Results  Component Value Date   WBC 7.3 02/19/2012   HGB 14.0 02/19/2012   HCT 42.6 02/19/2012   PLT 256.0 02/19/2012   CHOL 253* 02/19/2012   TRIG 110.0 02/19/2012   HDL 37.90* 02/19/2012   LDLDIRECT 178.6 02/19/2012   ALT 20 02/19/2012   AST 21 02/19/2012   NA 140 02/19/2012   K 4.1 02/19/2012   CL 109 02/19/2012   CREATININE 0.9 02/19/2012  BUN 16 02/19/2012   CO2 26 02/19/2012   TSH 1.60 02/19/2012   PSA 1.08 02/19/2012         Assessment & Plan:

## 2012-02-24 NOTE — Assessment & Plan Note (Signed)
Doxy x 1 mo if reoccurs Gabapentin prn sx's

## 2012-03-17 ENCOUNTER — Encounter: Payer: Self-pay | Admitting: Internal Medicine

## 2012-03-17 ENCOUNTER — Ambulatory Visit (INDEPENDENT_AMBULATORY_CARE_PROVIDER_SITE_OTHER): Payer: BC Managed Care – PPO | Admitting: Internal Medicine

## 2012-03-17 VITALS — BP 124/80 | HR 84 | Temp 98.0°F | Resp 16 | Wt 203.0 lb

## 2012-03-17 DIAGNOSIS — H6090 Unspecified otitis externa, unspecified ear: Secondary | ICD-10-CM

## 2012-03-17 DIAGNOSIS — H60399 Other infective otitis externa, unspecified ear: Secondary | ICD-10-CM

## 2012-03-17 HISTORY — DX: Unspecified otitis externa, unspecified ear: H60.90

## 2012-03-17 MED ORDER — NEOMYCIN-POLYMYXIN-HC 3.5-10000-1 OT SOLN: 3.0000 [drp] | Freq: Four times a day (QID) | OTIC | Status: DC

## 2012-03-17 NOTE — Assessment & Plan Note (Signed)
Cortisporin otic x 7 d

## 2012-03-17 NOTE — Progress Notes (Signed)
Patient ID: Adrian Cruz, male   DOB: 30-Sep-1965, 46 y.o.   MRN: [id number]   Subjective:    Patient ID: Adrian Cruz, male    DOB: [birth year], 46 y.o.   MRN: [id number]  Otalgia  There is pain in the right ear. This is a new problem. The current episode started yesterday. The problem occurs constantly. The pain is moderate. Pertinent negatives include no neck pain. His past medical history is significant for hearing loss.    The patient is here for a wellness exam. The patient has been doing well overall without major physical or psychological issues going on lately.   C/o prostate pain, testic pain and urinary difficulties x 3 wks, like in 2011  The patient needs to address to address chronic  hyperlipidemia - he stopped Lipitor due to muscle pains  Review of Systems  Constitutional: Negative for appetite change, fatigue and unexpected weight change.  HENT: Positive for ear pain. Negative for nosebleeds, congestion, sneezing, trouble swallowing and neck pain.   Eyes: Negative for itching and visual disturbance.  Cardiovascular: Negative for palpitations and leg swelling.  Gastrointestinal: Negative for blood in stool and abdominal distention.  Genitourinary: Negative for dysuria, urgency, frequency, hematuria, flank pain, decreased urine volume, discharge, difficulty urinating, genital sores, penile pain and testicular pain.  Musculoskeletal: Negative for back pain, joint swelling and gait problem.  Neurological: Negative for dizziness, tremors, speech difficulty and weakness.  Psychiatric/Behavioral: Negative for disturbed wake/sleep cycle, dysphoric mood and agitation. The patient is not nervous/anxious.    Wt Readings from Last 3 Encounters:  03/17/12 203 lb (92.08 kg)  02/24/12 211 lb (95.709 kg)  12/11/11 208 lb (94.348 kg)        Objective:   Physical Exam  Constitutional: He is oriented to person, place, and time. He appears well-developed and well-nourished. No  distress.  HENT:  Head: Normocephalic and atraumatic.  Right Ear: External ear normal.  Left Ear: External ear normal.  Nose: Nose normal.  Mouth/Throat: Oropharynx is clear and moist. No oropharyngeal exudate.  Eyes: Conjunctivae normal and EOM are normal. Pupils are equal, round, and reactive to light. Right eye exhibits no discharge. Left eye exhibits no discharge. No scleral icterus.  Neck: Normal range of motion. Neck supple. No JVD present. No tracheal deviation present. No thyromegaly present.  Cardiovascular: Normal rate, regular rhythm, normal heart sounds and intact distal pulses.  Exam reveals no gallop and no friction rub.   No murmur heard. Pulmonary/Chest: Effort normal and breath sounds normal. No stridor. No respiratory distress. He has no wheezes. He has no rales. He exhibits no tenderness.  Abdominal: Soft. Bowel sounds are normal. He exhibits no distension and no mass. There is no tenderness. There is no rebound and no guarding.  Genitourinary: Guaiac negative stool. No penile tenderness.       Prostate is sensitive; size is nl  Musculoskeletal: Normal range of motion. He exhibits no edema and no tenderness.  Lymphadenopathy:    He has no cervical adenopathy.  Neurological: He is alert and oriented to person, place, and time. He has normal reflexes. No cranial nerve deficit. He exhibits normal muscle tone. Coordination normal.  Skin: Skin is warm and dry. No rash noted. He is not diaphoretic. No erythema. No pallor.  Psychiatric: He has a normal mood and affect. His behavior is normal. Judgment and thought content normal.  R ear w/swelling  Lab Results  Component Value Date   WBC 7.3 02/19/2012   HGB 14.0 02/19/2012  HCT 42.6 02/19/2012   PLT 256.0 02/19/2012   CHOL 253* 02/19/2012   TRIG 110.0 02/19/2012   HDL 37.90* 02/19/2012   LDLDIRECT 178.6 02/19/2012   ALT 20 02/19/2012   AST 21 02/19/2012   NA 140 02/19/2012   K 4.1 02/19/2012   CL 109 02/19/2012   CREATININE 0.9  02/19/2012   BUN 16 02/19/2012   CO2 26 02/19/2012   TSH 1.60 02/19/2012   PSA 1.08 02/19/2012         Assessment & Plan:

## 2012-11-15 ENCOUNTER — Telehealth: Payer: Self-pay | Admitting: Internal Medicine

## 2012-11-15 DIAGNOSIS — Z0000 Encounter for general adult medical examination without abnormal findings: Secondary | ICD-10-CM

## 2012-11-15 NOTE — Telephone Encounter (Signed)
Pt has an CPE appt 11/30/12. Please put in lab work a week prior and call pt once we got the order in.

## 2012-11-16 NOTE — Telephone Encounter (Signed)
Patient notified

## 2012-11-28 ENCOUNTER — Other Ambulatory Visit: Payer: Self-pay | Admitting: *Deleted

## 2012-11-28 ENCOUNTER — Other Ambulatory Visit (INDEPENDENT_AMBULATORY_CARE_PROVIDER_SITE_OTHER): Payer: BC Managed Care – PPO

## 2012-11-28 DIAGNOSIS — Z0000 Encounter for general adult medical examination without abnormal findings: Secondary | ICD-10-CM

## 2012-11-28 LAB — COMPREHENSIVE METABOLIC PANEL
AST: 18 U/L (ref 0–37)
Albumin: 4.1 g/dL (ref 3.5–5.2)
Alkaline Phosphatase: 47 U/L (ref 39–117)
BUN: 17 mg/dL (ref 6–23)
Calcium: 9.1 mg/dL (ref 8.4–10.5)
Chloride: 106 mEq/L (ref 96–112)
Glucose, Bld: 104 mg/dL — ABNORMAL HIGH (ref 70–99)
Potassium: 4.4 mEq/L (ref 3.5–5.1)
Sodium: 139 mEq/L (ref 135–145)
Total Protein: 7.1 g/dL (ref 6.0–8.3)

## 2012-11-28 LAB — CBC WITH DIFFERENTIAL/PLATELET
Eosinophils Absolute: 0.4 10*3/uL (ref 0.0–0.7)
HCT: 44.1 % (ref 39.0–52.0)
Hemoglobin: 14.9 g/dL (ref 13.0–17.0)
Lymphs Abs: 3.4 10*3/uL (ref 0.7–4.0)
MCHC: 33.9 g/dL (ref 30.0–36.0)
Monocytes Relative: 10.8 % (ref 3.0–12.0)
Neutro Abs: 3.4 10*3/uL (ref 1.4–7.7)
RDW: 13.3 % (ref 11.5–14.6)

## 2012-11-28 LAB — URINALYSIS, ROUTINE W REFLEX MICROSCOPIC
Nitrite: NEGATIVE
Total Protein, Urine: NEGATIVE
pH: 6 (ref 5.0–8.0)

## 2012-11-28 LAB — LDL CHOLESTEROL, DIRECT: Direct LDL: 198.4 mg/dL

## 2012-11-28 LAB — LIPID PANEL: Cholesterol: 278 mg/dL — ABNORMAL HIGH (ref 0–200)

## 2012-11-28 LAB — TSH: TSH: 3.11 u[IU]/mL (ref 0.35–5.50)

## 2012-11-30 ENCOUNTER — Encounter: Payer: Self-pay | Admitting: Internal Medicine

## 2012-11-30 ENCOUNTER — Ambulatory Visit (INDEPENDENT_AMBULATORY_CARE_PROVIDER_SITE_OTHER): Payer: BC Managed Care – PPO | Admitting: Internal Medicine

## 2012-11-30 VITALS — BP 120/70 | HR 72 | Temp 98.0°F | Resp 16 | Ht 71.0 in | Wt 209.0 lb

## 2012-11-30 DIAGNOSIS — E785 Hyperlipidemia, unspecified: Secondary | ICD-10-CM

## 2012-11-30 DIAGNOSIS — N400 Enlarged prostate without lower urinary tract symptoms: Secondary | ICD-10-CM

## 2012-11-30 DIAGNOSIS — Z0000 Encounter for general adult medical examination without abnormal findings: Secondary | ICD-10-CM

## 2012-11-30 HISTORY — DX: Benign prostatic hyperplasia without lower urinary tract symptoms: N40.0

## 2012-11-30 MED ORDER — TRAMADOL HCL 50 MG PO TABS: 50.0000 mg | ORAL_TABLET | Freq: Two times a day (BID) | ORAL | Status: DC | PRN

## 2012-11-30 MED ORDER — ACYCLOVIR 400 MG PO TABS: 400.0000 mg | ORAL_TABLET | Freq: Three times a day (TID) | ORAL | Status: DC

## 2012-11-30 MED ORDER — ASPIRIN 325 MG PO TABS: 325.0000 mg | ORAL_TABLET | Freq: Every day | ORAL | Status: DC

## 2012-11-30 MED ORDER — PRAVASTATIN SODIUM 20 MG PO TABS: 20.0000 mg | ORAL_TABLET | Freq: Every day | ORAL | Status: DC

## 2012-11-30 MED ORDER — NORTRIPTYLINE HCL 10 MG PO CAPS: 10.0000 mg | ORAL_CAPSULE | Freq: Every day | ORAL | Status: DC

## 2012-11-30 NOTE — Assessment & Plan Note (Signed)
Continue with current prescription therapy as reflected on the Med list.  

## 2012-11-30 NOTE — Progress Notes (Signed)
Patient ID: Adrian Cruz, male   DOB: Oct 05, 1965, 47 y.o.   MRN: [id number]   Subjective:    Patient ID: Adrian Cruz, male    DOB: 31-Mar-1966, 47 y.o.   MRN: [id number]  HPI  The patient is here for a wellness exam. The patient has been doing well overall without major physical or psychological issues going on lately.   C/o prostate pain, testic pain and urinary difficulties x 3 wks, like in 2011  The patient needs to address to address chronic  hyperlipidemia - he stopped Lipitor due to muscle pains  Review of Systems  Constitutional: Negative for appetite change, fatigue and unexpected weight change.  HENT: Negative for nosebleeds, congestion, sneezing, trouble swallowing and neck pain.   Eyes: Negative for itching and visual disturbance.  Cardiovascular: Negative for palpitations and leg swelling.  Gastrointestinal: Negative for blood in stool and abdominal distention.  Genitourinary: Positive for urgency, difficulty urinating and testicular pain. Negative for dysuria, frequency, hematuria, flank pain, decreased urine volume, discharge, genital sores and penile pain.  Musculoskeletal: Negative for back pain, joint swelling and gait problem.  Neurological: Negative for dizziness, tremors, speech difficulty and weakness.  Psychiatric/Behavioral: Negative for sleep disturbance, dysphoric mood and agitation. The patient is not nervous/anxious.    Wt Readings from Last 3 Encounters:  11/30/12 209 lb (94.802 kg)  03/17/12 203 lb (92.08 kg)  02/24/12 211 lb (95.709 kg)        Objective:   Physical Exam  Constitutional: He is oriented to person, place, and time. He appears well-developed and well-nourished. No distress.  HENT:  Head: Normocephalic and atraumatic.  Right Ear: External ear normal.  Left Ear: External ear normal.  Nose: Nose normal.  Mouth/Throat: Oropharynx is clear and moist. No oropharyngeal exudate.  Eyes: Conjunctivae and EOM are normal. Pupils are  equal, round, and reactive to light. Right eye exhibits no discharge. Left eye exhibits no discharge. No scleral icterus.  Neck: Normal range of motion. Neck supple. No JVD present. No tracheal deviation present. No thyromegaly present.  Cardiovascular: Normal rate, regular rhythm, normal heart sounds and intact distal pulses.  Exam reveals no gallop and no friction rub.   No murmur heard. Pulmonary/Chest: Effort normal and breath sounds normal. No stridor. No respiratory distress. He has no wheezes. He has no rales. He exhibits no tenderness.  Abdominal: Soft. Bowel sounds are normal. He exhibits no distension and no mass. There is no tenderness. There is no rebound and no guarding.  Genitourinary: Rectum normal, prostate normal and penis normal. Guaiac negative stool. No penile tenderness.  Prostate size is nl  Musculoskeletal: Normal range of motion. He exhibits no edema and no tenderness.  Lymphadenopathy:    He has no cervical adenopathy.  Neurological: He is alert and oriented to person, place, and time. He has normal reflexes. No cranial nerve deficit. He exhibits normal muscle tone. Coordination normal.  Skin: Skin is warm and dry. No rash noted. He is not diaphoretic. No erythema. No pallor.  Psychiatric: He has a normal mood and affect. His behavior is normal. Judgment and thought content normal.    Lab Results  Component Value Date   WBC 8.1 11/28/2012   HGB 14.9 11/28/2012   HCT 44.1 11/28/2012   PLT 276.0 11/28/2012   CHOL 278* 11/28/2012   TRIG 198.0* 11/28/2012   HDL 39.70 11/28/2012   LDLDIRECT 198.4 11/28/2012   ALT 19 11/28/2012   AST 18 11/28/2012   NA 139 11/28/2012   K  4.4 11/28/2012   CL 106 11/28/2012   CREATININE 0.9 11/28/2012   BUN 17 11/28/2012   CO2 28 11/28/2012   TSH 3.11 11/28/2012   PSA 1.74 11/28/2012         Assessment & Plan:

## 2012-11-30 NOTE — Assessment & Plan Note (Signed)
Re-start Rx 

## 2012-11-30 NOTE — Patient Instructions (Signed)
Can use AZO for urinary symptoms

## 2012-11-30 NOTE — Assessment & Plan Note (Signed)
Empiric Acyclovir Trial of Norttrypt Ibuprofen, Tramadol prn

## 2013-01-12 ENCOUNTER — Telehealth: Payer: Self-pay | Admitting: *Deleted

## 2013-01-12 DIAGNOSIS — Z125 Encounter for screening for malignant neoplasm of prostate: Secondary | ICD-10-CM

## 2013-01-12 DIAGNOSIS — Z0000 Encounter for general adult medical examination without abnormal findings: Secondary | ICD-10-CM

## 2013-01-12 NOTE — Telephone Encounter (Signed)
Message copied by Merrilyn Puma on Thu Jan 12, 2013  1:28 PM ------      Message from: Etheleen Sia      Created: Wed Nov 30, 2012  4:16 PM      Regarding: LAB       PHYSICAL LABS FOR July 2015 ------

## 2013-01-12 NOTE — Telephone Encounter (Signed)
CPE labs entered.  

## 2013-03-30 ENCOUNTER — Other Ambulatory Visit: Payer: Self-pay

## 2013-05-25 HISTORY — PX: COLONOSCOPY W/ POLYPECTOMY: SHX1380

## 2013-10-29 ENCOUNTER — Encounter: Payer: Self-pay | Admitting: Internal Medicine

## 2013-11-27 ENCOUNTER — Other Ambulatory Visit (INDEPENDENT_AMBULATORY_CARE_PROVIDER_SITE_OTHER): Payer: 59

## 2013-11-27 DIAGNOSIS — Z125 Encounter for screening for malignant neoplasm of prostate: Secondary | ICD-10-CM

## 2013-11-27 DIAGNOSIS — Z0000 Encounter for general adult medical examination without abnormal findings: Secondary | ICD-10-CM

## 2013-11-27 LAB — CBC WITH DIFFERENTIAL/PLATELET
BASOS PCT: 0.5 % (ref 0.0–3.0)
Basophils Absolute: 0 10*3/uL (ref 0.0–0.1)
EOS ABS: 0.4 10*3/uL (ref 0.0–0.7)
EOS PCT: 5.1 % — AB (ref 0.0–5.0)
HCT: 42.1 % (ref 39.0–52.0)
HEMOGLOBIN: 14 g/dL (ref 13.0–17.0)
LYMPHS PCT: 38.7 % (ref 12.0–46.0)
Lymphs Abs: 3.1 10*3/uL (ref 0.7–4.0)
MCHC: 33.1 g/dL (ref 30.0–36.0)
MCV: 85.9 fl (ref 78.0–100.0)
MONOS PCT: 11.2 % (ref 3.0–12.0)
Monocytes Absolute: 0.9 10*3/uL (ref 0.1–1.0)
NEUTROS ABS: 3.6 10*3/uL (ref 1.4–7.7)
NEUTROS PCT: 44.5 % (ref 43.0–77.0)
Platelets: 257 10*3/uL (ref 150.0–400.0)
RBC: 4.9 Mil/uL (ref 4.22–5.81)
RDW: 13 % (ref 11.5–15.5)
WBC: 8.1 10*3/uL (ref 4.0–10.5)

## 2013-11-27 LAB — URINALYSIS, ROUTINE W REFLEX MICROSCOPIC
Bilirubin Urine: NEGATIVE
HGB URINE DIPSTICK: NEGATIVE
Ketones, ur: NEGATIVE
Leukocytes, UA: NEGATIVE
NITRITE: NEGATIVE
SPECIFIC GRAVITY, URINE: 1.025 (ref 1.000–1.030)
TOTAL PROTEIN, URINE-UPE24: NEGATIVE
UROBILINOGEN UA: 0.2 (ref 0.0–1.0)
Urine Glucose: NEGATIVE
pH: 6 (ref 5.0–8.0)

## 2013-11-27 LAB — HEPATIC FUNCTION PANEL
ALT: 20 U/L (ref 0–53)
AST: 18 U/L (ref 0–37)
Albumin: 3.9 g/dL (ref 3.5–5.2)
Alkaline Phosphatase: 47 U/L (ref 39–117)
BILIRUBIN TOTAL: 0.5 mg/dL (ref 0.2–1.2)
Bilirubin, Direct: 0 mg/dL (ref 0.0–0.3)
Total Protein: 6.6 g/dL (ref 6.0–8.3)

## 2013-11-27 LAB — BASIC METABOLIC PANEL
BUN: 19 mg/dL (ref 6–23)
CALCIUM: 8.8 mg/dL (ref 8.4–10.5)
CHLORIDE: 109 meq/L (ref 96–112)
CO2: 27 mEq/L (ref 19–32)
CREATININE: 1 mg/dL (ref 0.4–1.5)
GFR: 89.09 mL/min (ref >60.00)
Glucose, Bld: 106 mg/dL — ABNORMAL HIGH (ref 70–99)
Potassium: 4.4 mEq/L (ref 3.5–5.1)
Sodium: 141 mEq/L (ref 135–145)

## 2013-11-27 LAB — LIPID PANEL
CHOL/HDL RATIO: 5
Cholesterol: 230 mg/dL — ABNORMAL HIGH (ref 0–200)
HDL: 44.1 mg/dL (ref >39.00)
LDL CALC: 164 mg/dL — AB (ref 0–99)
NONHDL: 185.9
TRIGLYCERIDES: 112 mg/dL (ref 0.0–149.0)
VLDL: 22.4 mg/dL (ref 0.0–40.0)

## 2013-11-27 LAB — TSH: TSH: 2.84 u[IU]/mL (ref 0.35–4.50)

## 2013-11-27 LAB — PSA: PSA: 1.68 ng/mL (ref 0.10–4.00)

## 2013-12-06 ENCOUNTER — Encounter: Payer: Self-pay | Admitting: Internal Medicine

## 2013-12-06 ENCOUNTER — Ambulatory Visit (INDEPENDENT_AMBULATORY_CARE_PROVIDER_SITE_OTHER): Payer: 59 | Admitting: Internal Medicine

## 2013-12-06 VITALS — BP 120/78 | HR 76 | Temp 98.2°F | Resp 16 | Ht 70.0 in | Wt 204.0 lb

## 2013-12-06 DIAGNOSIS — Z1211 Encounter for screening for malignant neoplasm of colon: Secondary | ICD-10-CM

## 2013-12-06 DIAGNOSIS — E785 Hyperlipidemia, unspecified: Secondary | ICD-10-CM

## 2013-12-06 DIAGNOSIS — Z0000 Encounter for general adult medical examination without abnormal findings: Secondary | ICD-10-CM

## 2013-12-06 MED ORDER — PRAVASTATIN SODIUM 20 MG PO TABS: 20.0000 mg | ORAL_TABLET | Freq: Every day | ORAL | Status: DC

## 2013-12-06 NOTE — Assessment & Plan Note (Signed)
We discussed age appropriate health related issues, including available/recomended screening tests and vaccinations. We discussed a need for adhering to healthy diet and exercise. Labs/EKG were reviewed/ordered. All questions were answered.   

## 2013-12-06 NOTE — Progress Notes (Signed)
Pre visit review using our clinic review tool, if applicable. No additional management support is needed unless otherwise documented below in the visit note. 

## 2013-12-06 NOTE — Assessment & Plan Note (Signed)
Continue with current prescription therapy as reflected on the Med list.  

## 2013-12-06 NOTE — Progress Notes (Signed)
Subjective:    HPI  The patient is here for a wellness exam. The patient has been doing well overall without major physical or psychological issues going on lately.   F/u prostate pain, testic pain and urinary difficulties x 3 wks, like in 2011 - doing well, no relapse  The patient needs to address to address chronic  hyperlipidemia - tolerating well  Review of Systems  Constitutional: Negative for appetite change, fatigue and unexpected weight change.  HENT: Negative for congestion, nosebleeds, sneezing and trouble swallowing.   Eyes: Negative for itching and visual disturbance.  Cardiovascular: Negative for palpitations and leg swelling.  Gastrointestinal: Negative for blood in stool and abdominal distention.  Genitourinary: Positive for urgency, difficulty urinating and testicular pain. Negative for dysuria, frequency, hematuria, flank pain, decreased urine volume, discharge, genital sores and penile pain.  Musculoskeletal: Negative for back pain, gait problem, joint swelling and neck pain.  Neurological: Negative for dizziness, tremors, speech difficulty and weakness.  Psychiatric/Behavioral: Negative for sleep disturbance, dysphoric mood and agitation. The patient is not nervous/anxious.    Wt Readings from Last 3 Encounters:  12/06/13 204 lb (92.534 kg)  11/30/12 209 lb (94.802 kg)  03/17/12 203 lb (92.08 kg)        Objective:   Physical Exam  Constitutional: He is oriented to person, place, and time. He appears well-developed and well-nourished. No distress.  HENT:  Head: Normocephalic and atraumatic.  Right Ear: External ear normal.  Left Ear: External ear normal.  Nose: Nose normal.  Mouth/Throat: Oropharynx is clear and moist. No oropharyngeal exudate.  Eyes: Conjunctivae and EOM are normal. Pupils are equal, round, and reactive to light. Right eye exhibits no discharge. Left eye exhibits no discharge. No scleral icterus.  Neck: Normal range of motion. Neck  supple. No JVD present. No tracheal deviation present. No thyromegaly present.  Cardiovascular: Normal rate, regular rhythm, normal heart sounds and intact distal pulses.  Exam reveals no gallop and no friction rub.   No murmur heard. Pulmonary/Chest: Effort normal and breath sounds normal. No stridor. No respiratory distress. He has no wheezes. He has no rales. He exhibits no tenderness.  Abdominal: Soft. Bowel sounds are normal. He exhibits no distension and no mass. There is no tenderness. There is no rebound and no guarding.  Genitourinary: Rectum normal, prostate normal and penis normal. Guaiac negative stool. No penile tenderness.  Prostate size is nl  Musculoskeletal: Normal range of motion. He exhibits no edema and no tenderness.  Lymphadenopathy:    He has no cervical adenopathy.  Neurological: He is alert and oriented to person, place, and time. He has normal reflexes. No cranial nerve deficit. He exhibits normal muscle tone. Coordination normal.  Skin: Skin is warm and dry. No rash noted. He is not diaphoretic. No erythema. No pallor.  Psychiatric: He has a normal mood and affect. His behavior is normal. Judgment and thought content normal.    Lab Results  Component Value Date   WBC 8.1 11/27/2013   HGB 14.0 11/27/2013   HCT 42.1 11/27/2013   PLT 257.0 11/27/2013   CHOL 230* 11/27/2013   TRIG 112.0 11/27/2013   HDL 44.10 11/27/2013   LDLDIRECT 198.4 11/28/2012   ALT 20 11/27/2013   AST 18 11/27/2013   NA 141 11/27/2013   K 4.4 11/27/2013   CL 109 11/27/2013   CREATININE 1.0 11/27/2013   BUN 19 11/27/2013   CO2 27 11/27/2013   TSH 2.84 11/27/2013   PSA 1.68 11/27/2013  Assessment & Plan:

## 2013-12-28 ENCOUNTER — Encounter: Payer: Self-pay | Admitting: Internal Medicine

## 2014-01-02 ENCOUNTER — Other Ambulatory Visit: Payer: Self-pay | Admitting: Internal Medicine

## 2014-01-02 DIAGNOSIS — Z1211 Encounter for screening for malignant neoplasm of colon: Secondary | ICD-10-CM

## 2014-01-04 ENCOUNTER — Encounter: Payer: Self-pay | Admitting: Internal Medicine

## 2014-02-23 ENCOUNTER — Ambulatory Visit (AMBULATORY_SURGERY_CENTER): Payer: Self-pay

## 2014-02-23 VITALS — Ht 70.5 in | Wt 209.6 lb

## 2014-02-23 DIAGNOSIS — Z1211 Encounter for screening for malignant neoplasm of colon: Secondary | ICD-10-CM

## 2014-02-23 MED ORDER — MOVIPREP 100 G PO SOLR: 1.0000 | Freq: Once | ORAL | Status: DC

## 2014-02-23 NOTE — Progress Notes (Signed)
No allergies to eggs or soy No past problems with anesthesia No diet/weight loss meds No home oxygen  Has email  Emmi instructions given for colonoscopy 

## 2014-03-09 ENCOUNTER — Encounter: Payer: Self-pay | Admitting: Internal Medicine

## 2014-03-09 ENCOUNTER — Ambulatory Visit (AMBULATORY_SURGERY_CENTER): Payer: 59 | Admitting: Internal Medicine

## 2014-03-09 VITALS — BP 135/77 | HR 61 | Temp 96.5°F | Resp 13 | Ht 70.5 in | Wt 209.0 lb

## 2014-03-09 DIAGNOSIS — D125 Benign neoplasm of sigmoid colon: Secondary | ICD-10-CM

## 2014-03-09 DIAGNOSIS — Z1211 Encounter for screening for malignant neoplasm of colon: Secondary | ICD-10-CM

## 2014-03-09 MED ORDER — SODIUM CHLORIDE 0.9 % IV SOLN: 500.0000 mL | INTRAVENOUS | Status: DC

## 2014-03-09 NOTE — Progress Notes (Signed)
Report to PACU, RN, vss, BBS= Clear.  

## 2014-03-09 NOTE — Patient Instructions (Signed)
YOU HAD AN ENDOSCOPIC PROCEDURE TODAY AT THE [facility]: Refer to the procedure report that was given to you for any specific questions about what was found during the examination.  If the procedure report does not answer your questions, please call your gastroenterologist to clarify.  If you requested that your care partner not be given the details of your procedure findings, then the procedure report has been included in a sealed envelope for you to review at your convenience later.  YOU SHOULD EXPECT: Some feelings of bloating in the abdomen. Passage of more gas than usual.  Walking can help get rid of the air that was put into your GI tract during the procedure and reduce the bloating. If you had a lower endoscopy (such as a colonoscopy or flexible sigmoidoscopy) you may notice spotting of blood in your stool or on the toilet paper. If you underwent a bowel prep for your procedure, then you may not have a normal bowel movement for a few days.  DIET: Your first meal following the procedure should be a light meal and then it is ok to progress to your normal diet.  A half-sandwich or bowl of soup is an example of a good first meal.  Heavy or fried foods are harder to digest and may make you feel nauseous or bloated.  Likewise meals heavy in dairy and vegetables can cause extra gas to form and this can also increase the bloating.  Drink plenty of fluids but you should avoid alcoholic beverages for 24 hours.  ACTIVITY: Your care partner should take you home directly after the procedure.  You should plan to take it easy, moving slowly for the rest of the day.  You can resume normal activity the day after the procedure however you should NOT DRIVE or use heavy machinery for 24 hours (because of the sedation medicines used during the test).    SYMPTOMS TO REPORT IMMEDIATELY: A gastroenterologist can be reached at any hour.  During normal business hours, 8:30 AM to 5:00 PM Monday through Friday,  call [phone].  After hours and on weekends, please call the GI answering service at [phone] who will take a message and have the physician on call contact you.   Following lower endoscopy (colonoscopy or flexible sigmoidoscopy):  Excessive amounts of blood in the stool  Significant tenderness or worsening of abdominal pains  Swelling of the abdomen that is new, acute  Fever of 100F or higher  FOLLOW UP: If any biopsies were taken you will be contacted by phone or by letter within the next 1-3 weeks.  Call your gastroenterologist if you have not heard about the biopsies in 3 weeks.  Our staff will call the home number listed on your records the next business day following your procedure to check on you and address any questions or concerns that you may have at that time regarding the information given to you following your procedure. This is a courtesy call and so if there is no answer at the home number and we have not heard from you through the emergency physician on call, we will assume that you have returned to your regular daily activities without incident.  SIGNATURES/CONFIDENTIALITY: You and/or your care partner have signed paperwork which will be entered into your electronic medical record.  These signatures attest to the fact that that the information above on your After Visit Summary has been reviewed and is understood.  Full responsibility of the confidentiality of this   discharge information lies with you and/or your care-partner.    Resume medications. Information given on polyps with discharge instructions. 

## 2014-03-09 NOTE — Progress Notes (Signed)
Called to room to assist during endoscopic procedure.  Patient ID and intended procedure confirmed with present staff. Received instructions for my participation in the procedure from the performing physician.  

## 2014-03-09 NOTE — Op Note (Signed)
[facility]  [address] [place], [id number]   COLONOSCOPY PROCEDURE REPORT  PATIENT: Adrian Cruz, Adrian Cruz  MR#: [id number] BIRTHDATE: 25-May-1966 , 48  yrs. old GENDER: male ENDOSCOPIST: Eustace Quail, MD REFERRED HK:UVJD Avel Sensor, M.D. PROCEDURE DATE:  03/09/2014 PROCEDURE:   Colonoscopy with snare polypectomy x 1 First Screening Colonoscopy - Avg.  risk and is 50 yrs.  old or older Yes.  Prior Negative Screening - Now for repeat screening. N/A  History of Adenoma - Now for follow-up colonoscopy & has been > or = to 3 yrs.  N/A  Polyps Removed Today? Yes. ASA CLASS:   Class II INDICATIONS:average risk for colorectal cancer. MEDICATIONS: Monitored anesthesia care and Propofol 300 mg IV  DESCRIPTION OF PROCEDURE:   After the risks benefits and alternatives of the procedure were thoroughly explained, informed consent was obtained.  The digital rectal exam revealed no abnormalities of the rectum.   The LB [id number]  endoscope was introduced through the anus and advanced to the cecum, which was identified by both the appendix and ileocecal valve. No adverse events experienced.   The quality of the prep was excellent, using MoviPrep  The instrument was then slowly withdrawn as the colon was fully examined.    COLON FINDINGS: A single polyp measuring 2 mm in size was found in the sigmoid colon.  A polypectomy was performed with a cold snare. The resection was complete, the polyp tissue was completely retrieved and sent to histology.   The examination was otherwise normal.  Retroflexed views revealed internal hemorrhoids. The time to cecum=2 minutes 48 seconds.  Withdrawal time=10 minutes 51 seconds.  The scope was withdrawn and the procedure completed. COMPLICATIONS: There were no immediate complications.  ENDOSCOPIC IMPRESSION: 1.   Single polyp measuring 2 mm in size was found in the sigmoid colon; polypectomy was performed with a cold snare 2.    The examination was otherwise normal  RECOMMENDATIONS: 1. Repeat colonoscopy in 5 years if polyp adenomatous; otherwise 10 years  eSigned:  Eustace Quail, MD 03/09/2014 11:30 AM   cc: Altamese .  Plotnikov, MD and The Patient

## 2014-03-12 ENCOUNTER — Telehealth: Payer: Self-pay

## 2014-03-12 NOTE — Telephone Encounter (Signed)
  Follow up Call-  Call back number 03/09/2014  Post procedure Call Back phone  # [phone]  Permission to leave phone message Yes     Patient questions:  Do you have a fever, pain , or abdominal swelling? No. Pain Score  0 *  Have you tolerated food without any problems? Yes.    Have you been able to return to your normal activities? Yes.    Do you have any questions about your discharge instructions: Diet   No. Medications  No. Follow up visit  No.  Do you have questions or concerns about your Care? No.  Actions: * If pain score is 4 or above: No action needed, pain <4.

## 2014-03-14 ENCOUNTER — Encounter: Payer: Self-pay | Admitting: Internal Medicine

## 2014-11-22 ENCOUNTER — Telehealth: Payer: Self-pay | Admitting: Internal Medicine

## 2014-11-22 DIAGNOSIS — Z0000 Encounter for general adult medical examination without abnormal findings: Secondary | ICD-10-CM

## 2014-11-22 NOTE — Telephone Encounter (Signed)
Patient has a physical on 7/20 and was wanting to do his lab work before. Please advise

## 2014-11-23 NOTE — Telephone Encounter (Signed)
Labs ordered. Pt informed

## 2014-12-05 ENCOUNTER — Other Ambulatory Visit (INDEPENDENT_AMBULATORY_CARE_PROVIDER_SITE_OTHER): Payer: 59

## 2014-12-05 DIAGNOSIS — Z0000 Encounter for general adult medical examination without abnormal findings: Secondary | ICD-10-CM | POA: Diagnosis not present

## 2014-12-05 LAB — BASIC METABOLIC PANEL
BUN: 15 mg/dL (ref 6–23)
CO2: 27 mEq/L (ref 19–32)
Calcium: 9.2 mg/dL (ref 8.4–10.5)
Chloride: 106 mEq/L (ref 96–112)
Creatinine, Ser: 0.96 mg/dL (ref 0.40–1.50)
GFR: 87.67 mL/min (ref >60.00)
Glucose, Bld: 90 mg/dL (ref 70–99)
Potassium: 4.2 mEq/L (ref 3.5–5.1)
Sodium: 141 mEq/L (ref 135–145)

## 2014-12-05 LAB — CBC WITH DIFFERENTIAL/PLATELET
Basophils Absolute: 0 10*3/uL (ref 0.0–0.1)
Basophils Relative: 0.5 % (ref 0.0–3.0)
Eosinophils Absolute: 0.3 10*3/uL (ref 0.0–0.7)
Eosinophils Relative: 3.9 % (ref 0.0–5.0)
HCT: 43.6 % (ref 39.0–52.0)
Hemoglobin: 14.8 g/dL (ref 13.0–17.0)
LYMPHS PCT: 39.5 % (ref 12.0–46.0)
Lymphs Abs: 3.3 10*3/uL (ref 0.7–4.0)
MCHC: 33.8 g/dL (ref 30.0–36.0)
MCV: 84.4 fl (ref 78.0–100.0)
Monocytes Absolute: 0.9 10*3/uL (ref 0.1–1.0)
Monocytes Relative: 10.1 % (ref 3.0–12.0)
NEUTROS PCT: 46 % (ref 43.0–77.0)
Neutro Abs: 3.9 10*3/uL (ref 1.4–7.7)
Platelets: 267 10*3/uL (ref 150.0–400.0)
RBC: 5.17 Mil/uL (ref 4.22–5.81)
RDW: 13.2 % (ref 11.5–15.5)
WBC: 8.5 10*3/uL (ref 4.0–10.5)

## 2014-12-05 LAB — URINALYSIS, ROUTINE W REFLEX MICROSCOPIC
Bilirubin Urine: NEGATIVE
HGB URINE DIPSTICK: NEGATIVE
Ketones, ur: NEGATIVE
Leukocytes, UA: NEGATIVE
Nitrite: NEGATIVE
RBC / HPF: NONE SEEN (ref 0–?)
Specific Gravity, Urine: 1.02 (ref 1.000–1.030)
Total Protein, Urine: NEGATIVE
URINE GLUCOSE: NEGATIVE
UROBILINOGEN UA: 0.2 (ref 0.0–1.0)
WBC, UA: NONE SEEN (ref 0–?)
pH: 6 (ref 5.0–8.0)

## 2014-12-05 LAB — HEPATIC FUNCTION PANEL
ALT: 21 U/L (ref 0–53)
AST: 20 U/L (ref 0–37)
Albumin: 4.1 g/dL (ref 3.5–5.2)
Alkaline Phosphatase: 49 U/L (ref 39–117)
Bilirubin, Direct: 0.1 mg/dL (ref 0.0–0.3)
TOTAL PROTEIN: 7.1 g/dL (ref 6.0–8.3)
Total Bilirubin: 0.5 mg/dL (ref 0.2–1.2)

## 2014-12-05 LAB — LIPID PANEL
CHOL/HDL RATIO: 6
CHOLESTEROL: 247 mg/dL — AB (ref 0–200)
HDL: 41.7 mg/dL (ref >39.00)
LDL Cholesterol: 180 mg/dL — ABNORMAL HIGH (ref 0–99)
NonHDL: 205.3
Triglycerides: 128 mg/dL (ref 0.0–149.0)
VLDL: 25.6 mg/dL (ref 0.0–40.0)

## 2014-12-05 LAB — TSH: TSH: 2.32 u[IU]/mL (ref 0.35–4.50)

## 2014-12-05 LAB — PSA: PSA: 1.26 ng/mL (ref 0.10–4.00)

## 2014-12-12 ENCOUNTER — Encounter: Payer: Self-pay | Admitting: Internal Medicine

## 2014-12-12 ENCOUNTER — Ambulatory Visit (INDEPENDENT_AMBULATORY_CARE_PROVIDER_SITE_OTHER): Payer: 59 | Admitting: Internal Medicine

## 2014-12-12 VITALS — BP 138/82 | HR 62 | Temp 98.0°F | Resp 12 | Ht 70.0 in | Wt 209.0 lb

## 2014-12-12 DIAGNOSIS — E785 Hyperlipidemia, unspecified: Secondary | ICD-10-CM

## 2014-12-12 DIAGNOSIS — Z0000 Encounter for general adult medical examination without abnormal findings: Secondary | ICD-10-CM

## 2014-12-12 MED ORDER — PRAVASTATIN SODIUM 20 MG PO TABS: 20.0000 mg | ORAL_TABLET | Freq: Every day | ORAL | Status: DC

## 2014-12-12 MED ORDER — CICLOPIROX 8 % EX SOLN: Freq: Every day | CUTANEOUS | Status: DC

## 2014-12-12 NOTE — Progress Notes (Signed)
Subjective:  Patient ID: Adrian Cruz, male    DOB: 02-15-1966  Age: 49 y.o. MRN: [id number]  CC: No chief complaint on file.   HPI Adrian Cruz presents for well exam. Not taking Pravachol regulae  Outpatient Prescriptions Prior to Visit  Medication Sig Dispense Refill  . Cholecalciferol (VITAMIN D3) 1000 UNITS CAPS Take by mouth daily.      . pravastatin (PRAVACHOL) 20 MG tablet Take 1 tablet (20 mg total) by mouth daily. 90 tablet 3   No facility-administered medications prior to visit.    ROS Review of Systems  Constitutional: Positive for diaphoresis. Negative for appetite change, fatigue and unexpected weight change.  HENT: Negative for congestion, nosebleeds, sneezing, sore throat, trouble swallowing and voice change.   Eyes: Negative for itching and visual disturbance.  Respiratory: Negative for cough, chest tightness, shortness of breath and stridor.   Cardiovascular: Negative for chest pain, palpitations and leg swelling.  Gastrointestinal: Negative for nausea, diarrhea, blood in stool, abdominal distention and anal bleeding.  Genitourinary: Negative for frequency and hematuria.  Musculoskeletal: Negative for back pain, joint swelling, gait problem and neck pain.  Skin: Negative for rash and wound.  Allergic/Immunologic: Negative for food allergies.  Neurological: Negative for dizziness, tremors, speech difficulty and weakness.  Psychiatric/Behavioral: Negative for suicidal ideas, sleep disturbance, dysphoric mood and agitation. The patient is not nervous/anxious.     Objective:  BP 138/82 mmHg  Pulse 62  Temp(Src) 98 F (36.7 C) (Oral)  Resp 12  Ht 5\' 10"  (1.778 m)  Wt 209 lb (94.802 kg)  BMI 29.99 kg/m2  SpO2 98%  BP Readings from Last 3 Encounters:  12/12/14 138/82  03/09/14 135/77  12/06/13 120/78    Wt Readings from Last 3 Encounters:  12/12/14 209 lb (94.802 kg)  03/09/14 209 lb (94.802 kg)  02/23/14 209 lb 9.6 oz (95.074 kg)     Physical Exam  Constitutional: He is oriented to person, place, and time. He appears well-developed and well-nourished. No distress.  HENT:  Head: Normocephalic and atraumatic.  Right Ear: External ear normal.  Left Ear: External ear normal.  Nose: Nose normal.  Mouth/Throat: Oropharynx is clear and moist. No oropharyngeal exudate.  Eyes: Conjunctivae and EOM are normal. Pupils are equal, round, and reactive to light. Right eye exhibits no discharge. Left eye exhibits no discharge. No scleral icterus.  Neck: Normal range of motion. Neck supple. No JVD present. No tracheal deviation present. No thyromegaly present.  Cardiovascular: Normal rate, regular rhythm, normal heart sounds and intact distal pulses.  Exam reveals no gallop and no friction rub.   No murmur heard. Pulmonary/Chest: Effort normal and breath sounds normal. No stridor. No respiratory distress. He has no wheezes. He has no rales. He exhibits no tenderness.  Abdominal: Soft. Bowel sounds are normal. He exhibits no distension and no mass. There is no tenderness. There is no rebound and no guarding.  Genitourinary: Rectum normal and penis normal. Guaiac negative stool. No penile tenderness.  Prostate 1+  Musculoskeletal: Normal range of motion. He exhibits no edema or tenderness.  Lymphadenopathy:    He has no cervical adenopathy.  Neurological: He is alert and oriented to person, place, and time. He has normal reflexes. No cranial nerve deficit. He exhibits normal muscle tone. Coordination normal.  Skin: Skin is warm and dry. No rash noted. He is not diaphoretic. No erythema. No pallor.  Psychiatric: He has a normal mood and affect. His behavior is normal. Judgment and thought content normal.  Some toenails w/onycho Lab Results  Component Value Date   WBC 8.5 12/05/2014   HGB 14.8 12/05/2014   HCT 43.6 12/05/2014   PLT 267.0 12/05/2014   GLUCOSE 90 12/05/2014   CHOL 247* 12/05/2014   TRIG 128.0 12/05/2014   HDL  41.70 12/05/2014   LDLDIRECT 198.4 11/28/2012   LDLCALC 180* 12/05/2014   ALT 21 12/05/2014   AST 20 12/05/2014   NA 141 12/05/2014   K 4.2 12/05/2014   CL 106 12/05/2014   CREATININE 0.96 12/05/2014   BUN 15 12/05/2014   CO2 27 12/05/2014   TSH 2.32 12/05/2014   PSA 1.26 12/05/2014    No results found.  Assessment & Plan:   Diagnoses and all orders for this visit:  Well adult exam Orders: -     TSH; Future -     Basic metabolic panel; Future -     CBC with Differential/Platelet; Future -     Hepatic function panel; Future -     Lipid panel; Future -     Urinalysis; Future -     PSA; Future  Other orders -     pravastatin (PRAVACHOL) 20 MG tablet; Take 1 tablet (20 mg total) by mouth daily. -     ciclopirox (PENLAC) 8 % solution; Apply topically at bedtime. Apply over nail and surrounding skin. Apply daily over previous coat. After seven (7) days, may remove with alcohol and continue cycle.  I am having [name] start on ciclopirox. I am also having him maintain his Vitamin D3 and pravastatin.  Meds ordered this encounter  Medications  . pravastatin (PRAVACHOL) 20 MG tablet    Sig: Take 1 tablet (20 mg total) by mouth daily.    Dispense:  90 tablet    Refill:  3  . ciclopirox (PENLAC) 8 % solution    Sig: Apply topically at bedtime. Apply over nail and surrounding skin. Apply daily over previous coat. After seven (7) days, may remove with alcohol and continue cycle.    Dispense:  6.6 mL    Refill:  0     Follow-up: Return in about 1 year (around 12/12/2015) for Wellness Exam.  Walker Kehr, MD

## 2014-12-12 NOTE — Progress Notes (Signed)
Pre visit review using our clinic review tool, if applicable. No additional management support is needed unless otherwise documented below in the visit note. 

## 2014-12-12 NOTE — Assessment & Plan Note (Signed)
Take Pravastatin qd (not qod)

## 2014-12-12 NOTE — Assessment & Plan Note (Addendum)
We discussed age appropriate health related issues, including available/recomended screening tests and vaccinations. We discussed a need for adhering to healthy diet and exercise. Labs/EKG were reviewed/ordered. All questions were answered. Zostavax suggested Eye exam q 12 mo

## 2015-12-05 ENCOUNTER — Other Ambulatory Visit (INDEPENDENT_AMBULATORY_CARE_PROVIDER_SITE_OTHER): Payer: BLUE CROSS/BLUE SHIELD

## 2015-12-05 DIAGNOSIS — Z0000 Encounter for general adult medical examination without abnormal findings: Secondary | ICD-10-CM | POA: Diagnosis not present

## 2015-12-05 LAB — URINALYSIS
BILIRUBIN URINE: NEGATIVE
Hgb urine dipstick: NEGATIVE
KETONES UR: NEGATIVE
Leukocytes, UA: NEGATIVE
NITRITE: NEGATIVE
PH: 6 (ref 5.0–8.0)
Specific Gravity, Urine: 1.01 (ref 1.000–1.030)
TOTAL PROTEIN, URINE-UPE24: NEGATIVE
URINE GLUCOSE: NEGATIVE
Urobilinogen, UA: 0.2 (ref 0.0–1.0)

## 2015-12-05 LAB — CBC WITH DIFFERENTIAL/PLATELET
BASOS ABS: 0.1 10*3/uL (ref 0.0–0.1)
Basophils Relative: 0.7 % (ref 0.0–3.0)
EOS ABS: 0.3 10*3/uL (ref 0.0–0.7)
Eosinophils Relative: 3.6 % (ref 0.0–5.0)
HCT: 44.8 % (ref 39.0–52.0)
Hemoglobin: 15.1 g/dL (ref 13.0–17.0)
LYMPHS ABS: 3.3 10*3/uL (ref 0.7–4.0)
Lymphocytes Relative: 41.6 % (ref 12.0–46.0)
MCHC: 33.8 g/dL (ref 30.0–36.0)
MCV: 83.5 fl (ref 78.0–100.0)
Monocytes Absolute: 0.9 10*3/uL (ref 0.1–1.0)
Monocytes Relative: 11.9 % (ref 3.0–12.0)
NEUTROS ABS: 3.4 10*3/uL (ref 1.4–7.7)
NEUTROS PCT: 42.2 % — AB (ref 43.0–77.0)
PLATELETS: 260 10*3/uL (ref 150.0–400.0)
RBC: 5.36 Mil/uL (ref 4.22–5.81)
RDW: 13.5 % (ref 11.5–15.5)
WBC: 7.9 10*3/uL (ref 4.0–10.5)

## 2015-12-05 LAB — BASIC METABOLIC PANEL
BUN: 13 mg/dL (ref 6–23)
CALCIUM: 9.3 mg/dL (ref 8.4–10.5)
CO2: 30 meq/L (ref 19–32)
CREATININE: 0.91 mg/dL (ref 0.40–1.50)
Chloride: 99 mEq/L (ref 96–112)
GFR: 92.88 mL/min (ref >60.00)
GLUCOSE: 87 mg/dL (ref 70–99)
Potassium: 4.3 mEq/L (ref 3.5–5.1)
SODIUM: 147 meq/L — AB (ref 135–145)

## 2015-12-05 LAB — TSH: TSH: 3.14 u[IU]/mL (ref 0.35–4.50)

## 2015-12-05 LAB — HEPATIC FUNCTION PANEL
ALK PHOS: 49 U/L (ref 39–117)
ALT: 26 U/L (ref 0–53)
AST: 21 U/L (ref 0–37)
Albumin: 4.1 g/dL (ref 3.5–5.2)
BILIRUBIN DIRECT: 0.1 mg/dL (ref 0.0–0.3)
BILIRUBIN TOTAL: 0.6 mg/dL (ref 0.2–1.2)
Total Protein: 7 g/dL (ref 6.0–8.3)

## 2015-12-05 LAB — LIPID PANEL
CHOL/HDL RATIO: 6
Cholesterol: 216 mg/dL — ABNORMAL HIGH (ref 0–200)
HDL: 38.3 mg/dL — AB (ref >39.00)
LDL CALC: 151 mg/dL — AB (ref 0–99)
NONHDL: 178.06
Triglycerides: 137 mg/dL (ref 0.0–149.0)
VLDL: 27.4 mg/dL (ref 0.0–40.0)

## 2015-12-05 LAB — PSA: PSA: 1.1 ng/mL (ref 0.10–4.00)

## 2015-12-13 ENCOUNTER — Encounter: Payer: Self-pay | Admitting: Internal Medicine

## 2015-12-13 ENCOUNTER — Ambulatory Visit (INDEPENDENT_AMBULATORY_CARE_PROVIDER_SITE_OTHER): Payer: BLUE CROSS/BLUE SHIELD | Admitting: Internal Medicine

## 2015-12-13 VITALS — BP 106/64 | HR 68 | Ht 70.0 in | Wt 212.0 lb

## 2015-12-13 DIAGNOSIS — Z0000 Encounter for general adult medical examination without abnormal findings: Secondary | ICD-10-CM | POA: Diagnosis not present

## 2015-12-13 MED ORDER — PRAVASTATIN SODIUM 20 MG PO TABS: 20.0000 mg | ORAL_TABLET | Freq: Every day | ORAL | Status: DC

## 2015-12-13 MED ORDER — CICLOPIROX 8 % EX SOLN: Freq: Every day | CUTANEOUS | Status: DC

## 2015-12-13 NOTE — Assessment & Plan Note (Addendum)
We discussed age appropriate health related issues, including available/recomended screening tests and vaccinations. We discussed a need for adhering to healthy diet and exercise. Labs/EKG were reviewed/ordered. All questions were answered. EKG

## 2015-12-13 NOTE — Progress Notes (Signed)
Pre visit review using our clinic review tool, if applicable. No additional management support is needed unless otherwise documented below in the visit note. 

## 2015-12-13 NOTE — Progress Notes (Signed)
Subjective:  Patient ID: Adrian Cruz, male    DOB: [birth year]  Age: 50 y.o. MRN: [id number]  CC: Annual Exam   HPI Adrian Cruz presents for a well exam  Outpatient Prescriptions Prior to Visit  Medication Sig Dispense Refill  . Cholecalciferol (VITAMIN D3) 1000 UNITS CAPS Take by mouth daily.      . ciclopirox (PENLAC) 8 % solution Apply topically at bedtime. Apply over nail and surrounding skin. Apply daily over previous coat. After seven (7) days, may remove with alcohol and continue cycle. 6.6 mL 0  . pravastatin (PRAVACHOL) 20 MG tablet Take 1 tablet (20 mg total) by mouth daily. 90 tablet 3   No facility-administered medications prior to visit.    ROS Review of Systems  Constitutional: Negative for appetite change, fatigue and unexpected weight change.  HENT: Negative for congestion, nosebleeds, sneezing, sore throat and trouble swallowing.   Eyes: Negative for itching and visual disturbance.  Respiratory: Negative for cough.   Cardiovascular: Negative for chest pain, palpitations and leg swelling.  Gastrointestinal: Negative for nausea, diarrhea, blood in stool and abdominal distention.  Genitourinary: Negative for frequency and hematuria.  Musculoskeletal: Negative for back pain, joint swelling, gait problem and neck pain.  Skin: Negative for rash.  Neurological: Negative for dizziness, tremors, speech difficulty and weakness.  Psychiatric/Behavioral: Negative for sleep disturbance, dysphoric mood and agitation. The patient is not nervous/anxious.     Objective:  BP 106/64 mmHg  Pulse 68  Ht 5\' 10"  (1.778 m)  Wt 212 lb (96.163 kg)  BMI 30.42 kg/m2  SpO2 94%  BP Readings from Last 3 Encounters:  12/13/15 106/64  12/12/14 138/82  03/09/14 135/77    Wt Readings from Last 3 Encounters:  12/13/15 212 lb (96.163 kg)  12/12/14 209 lb (94.802 kg)  03/09/14 209 lb (94.802 kg)    Physical Exam  Constitutional: He is oriented to person, place, and  time. He appears well-developed. No distress.  NAD  HENT:  Mouth/Throat: Oropharynx is clear and moist.  Eyes: Conjunctivae are normal. Pupils are equal, round, and reactive to light.  Neck: Normal range of motion. No JVD present. No thyromegaly present.  Cardiovascular: Normal rate, regular rhythm, normal heart sounds and intact distal pulses.  Exam reveals no gallop and no friction rub.   No murmur heard. Pulmonary/Chest: Effort normal and breath sounds normal. No respiratory distress. He has no wheezes. He has no rales. He exhibits no tenderness.  Abdominal: Soft. Bowel sounds are normal. He exhibits no distension and no mass. There is no tenderness. There is no rebound and no guarding.  Genitourinary: Rectum normal and prostate normal. Guaiac negative stool.  Musculoskeletal: Normal range of motion. He exhibits no edema or tenderness.  Lymphadenopathy:    He has no cervical adenopathy.  Neurological: He is alert and oriented to person, place, and time. He has normal reflexes. No cranial nerve deficit. He exhibits normal muscle tone. He displays a negative Romberg sign. Coordination and gait normal.  Skin: Skin is warm and dry. No rash noted.  Psychiatric: He has a normal mood and affect. His behavior is normal. Judgment and thought content normal.   Procedure: EKG Indication: wellness Impression: NSR.   Lab Results  Component Value Date   WBC 7.9 12/05/2015   HGB 15.1 12/05/2015   HCT 44.8 12/05/2015   PLT 260.0 12/05/2015   GLUCOSE 87 12/05/2015   CHOL 216* 12/05/2015   TRIG 137.0 12/05/2015   HDL 38.30* 12/05/2015   LDLDIRECT 198.4  11/28/2012   LDLCALC 151* 12/05/2015   ALT 26 12/05/2015   AST 21 12/05/2015   NA 147* 12/05/2015   K 4.3 12/05/2015   CL 99 12/05/2015   CREATININE 0.91 12/05/2015   BUN 13 12/05/2015   CO2 30 12/05/2015   TSH 3.14 12/05/2015   PSA 1.10 12/05/2015    No results found.  Assessment & Plan:   Karriem was seen today for annual  exam.  Diagnoses and all orders for this visit:  Well adult exam -     EKG 12-Lead  Other orders -     pravastatin (PRAVACHOL) 20 MG tablet; Take 1 tablet (20 mg total) by mouth daily.   I am having Mr. Afable maintain his Vitamin D3, ciclopirox, and pravastatin.  Meds ordered this encounter  Medications  . pravastatin (PRAVACHOL) 20 MG tablet    Sig: Take 1 tablet (20 mg total) by mouth daily.    Dispense:  90 tablet    Refill:  3     Follow-up: No Follow-up on file.  Walker Kehr, MD

## 2016-07-02 ENCOUNTER — Encounter: Payer: Self-pay | Admitting: Internal Medicine

## 2016-07-03 ENCOUNTER — Other Ambulatory Visit: Payer: Self-pay | Admitting: Internal Medicine

## 2016-07-03 DIAGNOSIS — R300 Dysuria: Secondary | ICD-10-CM

## 2016-07-06 ENCOUNTER — Encounter: Payer: Self-pay | Admitting: Internal Medicine

## 2016-07-06 ENCOUNTER — Other Ambulatory Visit (INDEPENDENT_AMBULATORY_CARE_PROVIDER_SITE_OTHER): Payer: 59

## 2016-07-06 DIAGNOSIS — R300 Dysuria: Secondary | ICD-10-CM | POA: Diagnosis not present

## 2016-07-06 LAB — URINALYSIS
Bilirubin Urine: NEGATIVE
HGB URINE DIPSTICK: NEGATIVE
Ketones, ur: NEGATIVE
Leukocytes, UA: NEGATIVE
NITRITE: NEGATIVE
Specific Gravity, Urine: 1.015 (ref 1.000–1.030)
TOTAL PROTEIN, URINE-UPE24: NEGATIVE
URINE GLUCOSE: NEGATIVE
UROBILINOGEN UA: 0.2 (ref 0.0–1.0)
pH: 7 (ref 5.0–8.0)

## 2016-07-08 ENCOUNTER — Encounter: Payer: Self-pay | Admitting: Internal Medicine

## 2016-07-09 ENCOUNTER — Ambulatory Visit: Payer: BLUE CROSS/BLUE SHIELD | Admitting: Internal Medicine

## 2016-07-09 ENCOUNTER — Other Ambulatory Visit: Payer: Self-pay | Admitting: Internal Medicine

## 2016-07-09 MED ORDER — DOXYCYCLINE HYCLATE 100 MG PO TABS: 100.0000 mg | ORAL_TABLET | Freq: Two times a day (BID) | ORAL | 0 refills | Status: AC

## 2016-08-05 ENCOUNTER — Ambulatory Visit: Payer: BLUE CROSS/BLUE SHIELD | Admitting: Internal Medicine

## 2016-08-27 ENCOUNTER — Encounter: Payer: Self-pay | Admitting: Internal Medicine

## 2016-09-01 ENCOUNTER — Other Ambulatory Visit: Payer: Self-pay | Admitting: Internal Medicine

## 2016-09-01 DIAGNOSIS — Z0000 Encounter for general adult medical examination without abnormal findings: Secondary | ICD-10-CM

## 2016-09-04 ENCOUNTER — Other Ambulatory Visit (INDEPENDENT_AMBULATORY_CARE_PROVIDER_SITE_OTHER): Payer: 59

## 2016-09-04 DIAGNOSIS — Z0000 Encounter for general adult medical examination without abnormal findings: Secondary | ICD-10-CM | POA: Diagnosis not present

## 2016-09-04 LAB — CBC WITH DIFFERENTIAL/PLATELET
BASOS ABS: 0.1 10*3/uL (ref 0.0–0.1)
Basophils Relative: 0.7 % (ref 0.0–3.0)
EOS PCT: 3.2 % (ref 0.0–5.0)
Eosinophils Absolute: 0.3 10*3/uL (ref 0.0–0.7)
HEMATOCRIT: 44 % (ref 39.0–52.0)
HEMOGLOBIN: 14.6 g/dL (ref 13.0–17.0)
LYMPHS PCT: 43.8 % (ref 12.0–46.0)
Lymphs Abs: 4.1 10*3/uL — ABNORMAL HIGH (ref 0.7–4.0)
MCHC: 33.3 g/dL (ref 30.0–36.0)
MCV: 85 fl (ref 78.0–100.0)
MONOS PCT: 10.7 % (ref 3.0–12.0)
Monocytes Absolute: 1 10*3/uL (ref 0.1–1.0)
NEUTROS PCT: 41.6 % — AB (ref 43.0–77.0)
Neutro Abs: 3.9 10*3/uL (ref 1.4–7.7)
PLATELETS: 282 10*3/uL (ref 150.0–400.0)
RBC: 5.17 Mil/uL (ref 4.22–5.81)
RDW: 12.8 % (ref 11.5–15.5)
WBC: 9.3 10*3/uL (ref 4.0–10.5)

## 2016-09-04 LAB — BASIC METABOLIC PANEL
BUN: 13 mg/dL (ref 6–23)
CALCIUM: 9.1 mg/dL (ref 8.4–10.5)
CO2: 29 mEq/L (ref 19–32)
Chloride: 104 mEq/L (ref 96–112)
Creatinine, Ser: 0.98 mg/dL (ref 0.40–1.50)
GFR: 85.02 mL/min (ref >60.00)
GLUCOSE: 87 mg/dL (ref 70–99)
POTASSIUM: 3.9 meq/L (ref 3.5–5.1)
SODIUM: 139 meq/L (ref 135–145)

## 2016-09-04 LAB — LIPID PANEL
CHOL/HDL RATIO: 6
CHOLESTEROL: 294 mg/dL — AB (ref 0–200)
HDL: 47.2 mg/dL (ref >39.00)
LDL CALC: 208 mg/dL — AB (ref 0–99)
NONHDL: 246.87
TRIGLYCERIDES: 195 mg/dL — AB (ref 0.0–149.0)
VLDL: 39 mg/dL (ref 0.0–40.0)

## 2016-09-04 LAB — URINALYSIS
Bilirubin Urine: NEGATIVE
Hgb urine dipstick: NEGATIVE
KETONES UR: NEGATIVE
Leukocytes, UA: NEGATIVE
Nitrite: NEGATIVE
PH: 6 (ref 5.0–8.0)
SPECIFIC GRAVITY, URINE: 1.02 (ref 1.000–1.030)
TOTAL PROTEIN, URINE-UPE24: NEGATIVE
URINE GLUCOSE: NEGATIVE
Urobilinogen, UA: 0.2 (ref 0.0–1.0)

## 2016-09-04 LAB — TSH: TSH: 3.43 u[IU]/mL (ref 0.35–4.50)

## 2016-09-04 LAB — HEPATIC FUNCTION PANEL
ALBUMIN: 4.2 g/dL (ref 3.5–5.2)
ALK PHOS: 49 U/L (ref 39–117)
ALT: 34 U/L (ref 0–53)
AST: 24 U/L (ref 0–37)
Bilirubin, Direct: 0.1 mg/dL (ref 0.0–0.3)
Total Bilirubin: 0.7 mg/dL (ref 0.2–1.2)
Total Protein: 7 g/dL (ref 6.0–8.3)

## 2016-09-04 LAB — PSA: PSA: 1.35 ng/mL (ref 0.10–4.00)

## 2016-09-08 ENCOUNTER — Other Ambulatory Visit: Payer: 59

## 2016-09-08 ENCOUNTER — Ambulatory Visit (INDEPENDENT_AMBULATORY_CARE_PROVIDER_SITE_OTHER): Payer: 59 | Admitting: Internal Medicine

## 2016-09-08 ENCOUNTER — Encounter: Payer: Self-pay | Admitting: Internal Medicine

## 2016-09-08 VITALS — BP 110/60 | HR 72 | Temp 98.6°F | Ht 70.0 in | Wt 218.0 lb

## 2016-09-08 DIAGNOSIS — N400 Enlarged prostate without lower urinary tract symptoms: Secondary | ICD-10-CM

## 2016-09-08 DIAGNOSIS — Z0000 Encounter for general adult medical examination without abnormal findings: Secondary | ICD-10-CM

## 2016-09-08 MED ORDER — PRAVASTATIN SODIUM 20 MG PO TABS: 20.0000 mg | ORAL_TABLET | Freq: Every day | ORAL | 3 refills | Status: DC

## 2016-09-08 MED ORDER — VALACYCLOVIR HCL 500 MG PO TABS: 500.0000 mg | ORAL_TABLET | Freq: Two times a day (BID) | ORAL | 1 refills | Status: DC

## 2016-09-08 NOTE — Assessment & Plan Note (Addendum)
We discussed age appropriate health related issues, including available/recomended screening tests and vaccinations. We discussed a need for adhering to healthy diet and exercise. Labs were reviewed. All questions were answered. Shingrix info

## 2016-09-08 NOTE — Progress Notes (Signed)
Pre visit review using our clinic review tool, if applicable. No additional management support is needed unless otherwise documented below in the visit note. 

## 2016-09-08 NOTE — Progress Notes (Signed)
Subjective:  Patient ID: Adrian Cruz, male    DOB: 1965-12-05  Age: 51 y.o. MRN: [id number]  CC: No chief complaint on file.   HPI Chancelor Hardrick presents for a well exam C/o chronic groin and testicular discomfort   Outpatient Medications Prior to Visit  Medication Sig Dispense Refill  . Cholecalciferol (VITAMIN D3) 1000 UNITS CAPS Take by mouth daily.      . ciclopirox (PENLAC) 8 % solution Apply topically at bedtime. Apply over nail and surrounding skin. Apply daily over previous coat. After seven (7) days, may remove with alcohol and continue cycle. 6.6 mL 0  . pravastatin (PRAVACHOL) 20 MG tablet Take 1 tablet (20 mg total) by mouth daily. 90 tablet 3   No facility-administered medications prior to visit.     ROS Review of Systems  Constitutional: Negative for appetite change, fatigue and unexpected weight change.  HENT: Negative for congestion, nosebleeds, sneezing, sore throat and trouble swallowing.   Eyes: Negative for itching and visual disturbance.  Respiratory: Negative for cough.   Cardiovascular: Negative for chest pain, palpitations and leg swelling.  Gastrointestinal: Negative for abdominal distention, blood in stool, diarrhea and nausea.  Genitourinary: Positive for dysuria and frequency. Negative for hematuria.  Musculoskeletal: Negative for back pain, gait problem, joint swelling and neck pain.  Skin: Negative for rash.  Neurological: Negative for dizziness, tremors, speech difficulty and weakness.  Psychiatric/Behavioral: Negative for agitation, dysphoric mood and sleep disturbance. The patient is not nervous/anxious.     Objective:  BP 110/60 (BP Location: Left Arm, Patient Position: Sitting, Cuff Size: Large)   Pulse 72   Temp 98.6 F (37 C) (Oral)   Ht 5\' 10"  (1.778 m)   Wt 218 lb (98.9 kg)   SpO2 99%   BMI 31.28 kg/m   BP Readings from Last 3 Encounters:  09/08/16 110/60  12/13/15 106/64  12/12/14 138/82    Wt Readings from Last  3 Encounters:  09/08/16 218 lb (98.9 kg)  12/13/15 212 lb (96.2 kg)  12/12/14 209 lb (94.8 kg)    Physical Exam  Constitutional: He is oriented to person, place, and time. He appears well-developed. No distress.  NAD  HENT:  Mouth/Throat: Oropharynx is clear and moist.  Eyes: Conjunctivae are normal. Pupils are equal, round, and reactive to light.  Neck: Normal range of motion. No JVD present. No thyromegaly present.  Cardiovascular: Normal rate, regular rhythm, normal heart sounds and intact distal pulses.  Exam reveals no gallop and no friction rub.   No murmur heard. Pulmonary/Chest: Effort normal and breath sounds normal. No respiratory distress. He has no wheezes. He has no rales. He exhibits no tenderness.  Abdominal: Soft. Bowel sounds are normal. He exhibits no distension and no mass. There is no tenderness. There is no rebound and no guarding.  Genitourinary: Rectum normal and penis normal. Rectal exam shows guaiac negative stool.  Musculoskeletal: Normal range of motion. He exhibits no edema or tenderness.  Lymphadenopathy:    He has no cervical adenopathy.  Neurological: He is alert and oriented to person, place, and time. He has normal reflexes. No cranial nerve deficit. He exhibits normal muscle tone. He displays a negative Romberg sign. Coordination and gait normal.  Skin: Skin is warm and dry. No rash noted.  Psychiatric: He has a normal mood and affect. His behavior is normal. Judgment and thought content normal.  Prostate is NT, 1+  Lab Results  Component Value Date   WBC 9.3 09/04/2016   HGB  14.6 09/04/2016   HCT 44.0 09/04/2016   PLT 282.0 09/04/2016   GLUCOSE 87 09/04/2016   CHOL 294 (H) 09/04/2016   TRIG 195.0 (H) 09/04/2016   HDL 47.20 09/04/2016   LDLDIRECT 198.4 11/28/2012   LDLCALC 208 (H) 09/04/2016   ALT 34 09/04/2016   AST 24 09/04/2016   NA 139 09/04/2016   K 3.9 09/04/2016   CL 104 09/04/2016   CREATININE 0.98 09/04/2016   BUN 13 09/04/2016    CO2 29 09/04/2016   TSH 3.43 09/04/2016   PSA 1.35 09/04/2016    No results found.  Assessment & Plan:   There are no diagnoses linked to this encounter. I am having Mr. Knoop maintain his Vitamin D3, pravastatin, and ciclopirox.  No orders of the defined types were placed in this encounter.    Follow-up: No Follow-up on file.  Walker Kehr, MD

## 2016-09-08 NOTE — Assessment & Plan Note (Addendum)
Took abx - it helped Sx's have re-occurred - discomfort No h/o genital herpes. Empiric Valterex

## 2016-09-08 NOTE — Patient Instructions (Addendum)
Shingrix vaccine

## 2016-09-09 LAB — HSV 2 ANTIBODY, IGG: HSV 2 Glycoprotein G Ab, IgG: 1.78 Index — ABNORMAL HIGH (ref <0.90)

## 2016-12-14 ENCOUNTER — Encounter: Payer: BLUE CROSS/BLUE SHIELD | Admitting: Internal Medicine

## 2016-12-18 ENCOUNTER — Telehealth: Payer: Self-pay | Admitting: Internal Medicine

## 2016-12-18 MED ORDER — PRAVASTATIN SODIUM 20 MG PO TABS: 20.0000 mg | ORAL_TABLET | Freq: Every day | ORAL | 2 refills | Status: DC

## 2016-12-18 NOTE — Telephone Encounter (Signed)
Pt is needing a refill on pravastatin (PRAVACHOL) 20 MG tablet to be sent to [facility] on [address]

## 2016-12-18 NOTE — Telephone Encounter (Signed)
Verified record updated pharmacy sent rx to [facility].../lmb

## 2017-01-19 ENCOUNTER — Ambulatory Visit (INDEPENDENT_AMBULATORY_CARE_PROVIDER_SITE_OTHER): Payer: 59 | Admitting: Internal Medicine

## 2017-01-19 ENCOUNTER — Ambulatory Visit (INDEPENDENT_AMBULATORY_CARE_PROVIDER_SITE_OTHER)
Admission: RE | Admit: 2017-01-19 | Discharge: 2017-01-19 | Disposition: A | Discharge status: Home / Self Care | Payer: 59 | Source: Ambulatory Visit | Attending: Internal Medicine | Admitting: Internal Medicine

## 2017-01-19 ENCOUNTER — Other Ambulatory Visit (INDEPENDENT_AMBULATORY_CARE_PROVIDER_SITE_OTHER): Payer: 59

## 2017-01-19 ENCOUNTER — Encounter: Payer: Self-pay | Admitting: Internal Medicine

## 2017-01-19 ENCOUNTER — Other Ambulatory Visit: Payer: Self-pay | Admitting: Internal Medicine

## 2017-01-19 DIAGNOSIS — R509 Fever, unspecified: Secondary | ICD-10-CM

## 2017-01-19 DIAGNOSIS — J181 Lobar pneumonia, unspecified organism: Secondary | ICD-10-CM | POA: Diagnosis not present

## 2017-01-19 HISTORY — DX: Fever, unspecified: R50.9

## 2017-01-19 LAB — BASIC METABOLIC PANEL
BUN: 11 mg/dL (ref 6–23)
CALCIUM: 9.2 mg/dL (ref 8.4–10.5)
CO2: 29 meq/L (ref 19–32)
CREATININE: 0.89 mg/dL (ref 0.40–1.50)
Chloride: 100 mEq/L (ref 96–112)
GFR: 94.88 mL/min (ref >60.00)
GLUCOSE: 92 mg/dL (ref 70–99)
Potassium: 3.7 mEq/L (ref 3.5–5.1)
Sodium: 136 mEq/L (ref 135–145)

## 2017-01-19 LAB — HEPATIC FUNCTION PANEL
ALT: 16 U/L (ref 0–53)
AST: 15 U/L (ref 0–37)
Albumin: 4.1 g/dL (ref 3.5–5.2)
Alkaline Phosphatase: 42 U/L (ref 39–117)
BILIRUBIN DIRECT: 0.3 mg/dL (ref 0.0–0.3)
BILIRUBIN TOTAL: 0.7 mg/dL (ref 0.2–1.2)
TOTAL PROTEIN: 7.8 g/dL (ref 6.0–8.3)

## 2017-01-19 LAB — CBC WITH DIFFERENTIAL/PLATELET
BASOS ABS: 0 10*3/uL (ref 0.0–0.1)
Basophils Relative: 0.3 % (ref 0.0–3.0)
EOS ABS: 0 10*3/uL (ref 0.0–0.7)
Eosinophils Relative: 0.2 % (ref 0.0–5.0)
HCT: 42.1 % (ref 39.0–52.0)
Hemoglobin: 14.1 g/dL (ref 13.0–17.0)
LYMPHS ABS: 1.5 10*3/uL (ref 0.7–4.0)
Lymphocytes Relative: 15.6 % (ref 12.0–46.0)
MCHC: 33.5 g/dL (ref 30.0–36.0)
MCV: 86.3 fl (ref 78.0–100.0)
Monocytes Absolute: 1.4 10*3/uL — ABNORMAL HIGH (ref 0.1–1.0)
Monocytes Relative: 14.2 % — ABNORMAL HIGH (ref 3.0–12.0)
NEUTROS ABS: 6.8 10*3/uL (ref 1.4–7.7)
NEUTROS PCT: 69.7 % (ref 43.0–77.0)
PLATELETS: 205 10*3/uL (ref 150.0–400.0)
RBC: 4.87 Mil/uL (ref 4.22–5.81)
RDW: 13 % (ref 11.5–15.5)
WBC: 9.7 10*3/uL (ref 4.0–10.5)

## 2017-01-19 LAB — URINALYSIS
Bilirubin Urine: NEGATIVE
Ketones, ur: NEGATIVE
Leukocytes, UA: NEGATIVE
NITRITE: NEGATIVE
PH: 6 (ref 5.0–8.0)
SPECIFIC GRAVITY, URINE: 1.025 (ref 1.000–1.030)
Urine Glucose: NEGATIVE
Urobilinogen, UA: 1 (ref 0.0–1.0)

## 2017-01-19 LAB — TSH: TSH: 2.51 u[IU]/mL (ref 0.35–4.50)

## 2017-01-19 LAB — SEDIMENTATION RATE: Sed Rate: 45 mm/hr — ABNORMAL HIGH (ref 0–20)

## 2017-01-19 MED ORDER — LEVOFLOXACIN 500 MG PO TABS: 500.0000 mg | ORAL_TABLET | Freq: Every day | ORAL | 0 refills | Status: AC

## 2017-01-19 MED ORDER — DOXYCYCLINE HYCLATE 100 MG PO TABS: 100.0000 mg | ORAL_TABLET | Freq: Two times a day (BID) | ORAL | 0 refills | Status: DC

## 2017-01-19 MED ORDER — VALACYCLOVIR HCL 500 MG PO TABS: 500.0000 mg | ORAL_TABLET | Freq: Two times a day (BID) | ORAL | 1 refills | Status: DC

## 2017-01-19 NOTE — Progress Notes (Signed)
Subjective:  Patient ID: Adrian Cruz, male    DOB: [birth year]  Age: 52 y.o. MRN: [id number]  CC: No chief complaint on file.   HPI Adrian Cruz presents for fever 102.5, shaking chills, sweats since Saturday. No tick bites. He worked in a [employer], fashion/clothing. A little better today...  Outpatient Medications Prior to Visit  Medication Sig Dispense Refill  . Cholecalciferol (VITAMIN D3) 1000 UNITS CAPS Take by mouth daily.      . ciclopirox (PENLAC) 8 % solution Apply topically at bedtime. Apply over nail and surrounding skin. Apply daily over previous coat. After seven (7) days, may remove with alcohol and continue cycle. 6.6 mL 0  . pravastatin (PRAVACHOL) 20 MG tablet Take 1 tablet (20 mg total) by mouth daily. 90 tablet 2  . valACYclovir (VALTREX) 500 MG tablet Take 1 tablet (500 mg total) by mouth 2 (two) times daily. 60 tablet 1   No facility-administered medications prior to visit.     ROS Review of Systems  Constitutional: Positive for chills, fatigue and fever. Negative for appetite change and unexpected weight change.  HENT: Negative for congestion, nosebleeds, sneezing, sore throat and trouble swallowing.   Eyes: Negative for itching and visual disturbance.  Respiratory: Negative for cough.   Cardiovascular: Negative for chest pain, palpitations and leg swelling.  Gastrointestinal: Negative for abdominal distention, blood in stool, diarrhea and nausea.  Genitourinary: Negative for frequency and hematuria.  Musculoskeletal: Negative for back pain, gait problem, joint swelling and neck pain.  Skin: Negative for rash.  Neurological: Positive for weakness. Negative for dizziness, tremors and speech difficulty.  Psychiatric/Behavioral: Negative for agitation, dysphoric mood and sleep disturbance. The patient is not nervous/anxious.     Objective:  BP 102/68 (BP Location: Left Arm, Patient Position: Sitting, Cuff Size: Large)   Pulse 76   Temp 99.4 F (37.4 C) (Oral)    Ht 5\' 10"  (1.778 m)   Wt 213 lb (96.6 kg)   SpO2 99%   BMI 30.56 kg/m   BP Readings from Last 3 Encounters:  01/19/17 102/68  09/08/16 110/60  12/13/15 106/64    Wt Readings from Last 3 Encounters:  01/19/17 213 lb (96.6 kg)  09/08/16 218 lb (98.9 kg)  12/13/15 212 lb (96.2 kg)    Physical Exam  Constitutional: He is oriented to person, place, and time. He appears well-developed. No distress.  NAD  HENT:  Mouth/Throat: Oropharynx is clear and moist.  Eyes: Pupils are equal, round, and reactive to light. Conjunctivae are normal.  Neck: Normal range of motion. No JVD present. No thyromegaly present.  Cardiovascular: Normal rate, regular rhythm, normal heart sounds and intact distal pulses.  Exam reveals no gallop and no friction rub.   No murmur heard. Pulmonary/Chest: Effort normal and breath sounds normal. No respiratory distress. He has no wheezes. He has no rales. He exhibits no tenderness.  Abdominal: Soft. Bowel sounds are normal. He exhibits no distension and no mass. There is no tenderness. There is no rebound and no guarding.  Musculoskeletal: Normal range of motion. He exhibits no edema or tenderness.  Lymphadenopathy:    He has no cervical adenopathy.  Neurological: He is alert and oriented to person, place, and time. He has normal reflexes. No cranial nerve deficit. He exhibits normal muscle tone. He displays a negative Romberg sign. Coordination and gait normal.  Skin: Skin is warm and dry. No rash noted.  Psychiatric: He has a normal mood and affect. His behavior is normal. Judgment and  thought content normal.    Lab Results  Component Value Date   WBC 9.3 09/04/2016   HGB 14.6 09/04/2016   HCT 44.0 09/04/2016   PLT 282.0 09/04/2016   GLUCOSE 87 09/04/2016   CHOL 294 (H) 09/04/2016   TRIG 195.0 (H) 09/04/2016   HDL 47.20 09/04/2016   LDLDIRECT 198.4 11/28/2012   LDLCALC 208 (H) 09/04/2016   ALT 34 09/04/2016   AST 24 09/04/2016   NA 139 09/04/2016   K  3.9 09/04/2016   CL 104 09/04/2016   CREATININE 0.98 09/04/2016   BUN 13 09/04/2016   CO2 29 09/04/2016   TSH 3.43 09/04/2016   PSA 1.35 09/04/2016    No results found.  Assessment & Plan:   There are no diagnoses linked to this encounter. I am having Mr. Claytor maintain his Vitamin D3, ciclopirox, valACYclovir, and pravastatin.  No orders of the defined types were placed in this encounter.    Follow-up: No Follow-up on file.  Walker Kehr, MD

## 2017-01-19 NOTE — Assessment & Plan Note (Signed)
feeling better today Labs, blood cx, UA, CXR RMSF/Lyme tests if needed Doxy Rx to take to [place]

## 2017-01-20 ENCOUNTER — Encounter: Payer: Self-pay | Admitting: Internal Medicine

## 2017-01-21 NOTE — Telephone Encounter (Signed)
He would like to come pick this up today? Is this possible>? Please call when ready to be picked up

## 2017-01-22 NOTE — Telephone Encounter (Signed)
Pt notified letters ready

## 2017-01-25 LAB — CULTURE, BLOOD (SINGLE): ORGANISM ID, BACTERIA: NO GROWTH

## 2017-02-07 ENCOUNTER — Encounter: Payer: Self-pay | Admitting: Internal Medicine

## 2017-02-08 NOTE — Telephone Encounter (Signed)
Pt called back regarding this, would like help and would like answer as to what to do. Please advise

## 2017-02-08 NOTE — Telephone Encounter (Signed)
Pt called checking on this. He said that he can be reached on his cell phone at  [phone].

## 2017-02-13 ENCOUNTER — Other Ambulatory Visit: Payer: Self-pay | Admitting: Internal Medicine

## 2017-03-21 ENCOUNTER — Encounter: Payer: Self-pay | Admitting: Internal Medicine

## 2017-03-24 NOTE — Telephone Encounter (Signed)
Pt called checking on this  

## 2017-03-25 ENCOUNTER — Encounter: Payer: Self-pay | Admitting: Internal Medicine

## 2017-03-26 ENCOUNTER — Other Ambulatory Visit: Payer: Self-pay | Admitting: Internal Medicine

## 2017-03-26 DIAGNOSIS — J181 Lobar pneumonia, unspecified organism: Principal | ICD-10-CM

## 2017-03-26 DIAGNOSIS — J189 Pneumonia, unspecified organism: Secondary | ICD-10-CM

## 2017-03-26 NOTE — Telephone Encounter (Signed)
Pt called in again about this?  He is wanting call back today

## 2017-03-29 ENCOUNTER — Ambulatory Visit (INDEPENDENT_AMBULATORY_CARE_PROVIDER_SITE_OTHER)
Admission: RE | Admit: 2017-03-29 | Discharge: 2017-03-29 | Disposition: A | Discharge status: Home / Self Care | Payer: 59 | Source: Ambulatory Visit | Attending: Internal Medicine | Admitting: Internal Medicine

## 2017-03-29 DIAGNOSIS — J181 Lobar pneumonia, unspecified organism: Secondary | ICD-10-CM | POA: Diagnosis not present

## 2017-03-29 DIAGNOSIS — J189 Pneumonia, unspecified organism: Secondary | ICD-10-CM

## 2017-03-29 DIAGNOSIS — R0602 Shortness of breath: Secondary | ICD-10-CM | POA: Diagnosis not present

## 2017-04-28 ENCOUNTER — Encounter: Payer: Self-pay | Admitting: Internal Medicine

## 2017-04-28 ENCOUNTER — Other Ambulatory Visit (INDEPENDENT_AMBULATORY_CARE_PROVIDER_SITE_OTHER): Payer: 59

## 2017-04-28 ENCOUNTER — Ambulatory Visit: Payer: Self-pay | Admitting: *Deleted

## 2017-04-28 ENCOUNTER — Ambulatory Visit (INDEPENDENT_AMBULATORY_CARE_PROVIDER_SITE_OTHER): Payer: 59 | Admitting: Internal Medicine

## 2017-04-28 VITALS — BP 118/80 | HR 82 | Temp 98.3°F | Ht 70.0 in | Wt 214.0 lb

## 2017-04-28 DIAGNOSIS — K130 Diseases of lips: Secondary | ICD-10-CM

## 2017-04-28 DIAGNOSIS — K921 Melena: Secondary | ICD-10-CM | POA: Diagnosis not present

## 2017-04-28 HISTORY — DX: Diseases of lips: K13.0

## 2017-04-28 HISTORY — DX: Melena: K92.1

## 2017-04-28 LAB — PROTIME-INR
INR: 1.1 ratio — ABNORMAL HIGH (ref 0.8–1.0)
PROTHROMBIN TIME: 12.1 s (ref 9.6–13.1)

## 2017-04-28 LAB — CBC WITH DIFFERENTIAL/PLATELET
BASOS PCT: 0.8 % (ref 0.0–3.0)
Basophils Absolute: 0.1 10*3/uL (ref 0.0–0.1)
EOS ABS: 0.1 10*3/uL (ref 0.0–0.7)
Eosinophils Relative: 0.8 % (ref 0.0–5.0)
HCT: 45.4 % (ref 39.0–52.0)
HEMOGLOBIN: 15 g/dL (ref 13.0–17.0)
Lymphocytes Relative: 37.8 % (ref 12.0–46.0)
Lymphs Abs: 2.8 10*3/uL (ref 0.7–4.0)
MCHC: 33.1 g/dL (ref 30.0–36.0)
MCV: 85.8 fl (ref 78.0–100.0)
MONO ABS: 0.6 10*3/uL (ref 0.1–1.0)
Monocytes Relative: 7.5 % (ref 3.0–12.0)
NEUTROS PCT: 53.1 % (ref 43.0–77.0)
Neutro Abs: 3.9 10*3/uL (ref 1.4–7.7)
Platelets: 276 10*3/uL (ref 150.0–400.0)
RBC: 5.28 Mil/uL (ref 4.22–5.81)
RDW: 13.7 % (ref 11.5–15.5)
WBC: 7.4 10*3/uL (ref 4.0–10.5)

## 2017-04-28 LAB — BASIC METABOLIC PANEL
BUN: 10 mg/dL (ref 6–23)
CALCIUM: 9.4 mg/dL (ref 8.4–10.5)
CO2: 30 mEq/L (ref 19–32)
Chloride: 103 mEq/L (ref 96–112)
Creatinine, Ser: 0.86 mg/dL (ref 0.40–1.50)
GFR: 98.61 mL/min (ref >60.00)
GLUCOSE: 102 mg/dL — AB (ref 70–99)
POTASSIUM: 4 meq/L (ref 3.5–5.1)
SODIUM: 140 meq/L (ref 135–145)

## 2017-04-28 LAB — HEPATIC FUNCTION PANEL
ALBUMIN: 4.5 g/dL (ref 3.5–5.2)
ALT: 20 U/L (ref 0–53)
AST: 18 U/L (ref 0–37)
Alkaline Phosphatase: 50 U/L (ref 39–117)
Bilirubin, Direct: 0 mg/dL (ref 0.0–0.3)
TOTAL PROTEIN: 7.5 g/dL (ref 6.0–8.3)
Total Bilirubin: 0.5 mg/dL (ref 0.2–1.2)

## 2017-04-28 MED ORDER — CLOTRIMAZOLE-BETAMETHASONE 1-0.05 % EX CREA: 1.0000 "application " | TOPICAL_CREAM | Freq: Two times a day (BID) | CUTANEOUS | 1 refills | Status: DC

## 2017-04-28 NOTE — Assessment & Plan Note (Addendum)
Lotrisone Rx Use [employer] Peroxicare tooth paste

## 2017-04-28 NOTE — Assessment & Plan Note (Addendum)
?  etiology Anoscopy nl Labs GI ref

## 2017-04-28 NOTE — Telephone Encounter (Signed)
Called pt concerning mychart msg made appt for this am @ 11:15...Adrian Cruz

## 2017-04-28 NOTE — Telephone Encounter (Signed)
Called pt made appt w/MD for 11:15 MD has Ok'd...Adrian Cruz

## 2017-04-28 NOTE — Progress Notes (Addendum)
Subjective:  Patient ID: Adrian Cruz, male    DOB: Nov 21, 1965  Age: 51 y.o. MRN: [id number]  CC: No chief complaint on file.   HPI Adrian Cruz presents for blood in stool x 3 d (red water). No pain. No change in bowel habits C/o lips irritation  Outpatient Medications Prior to Visit  Medication Sig Dispense Refill  . Cholecalciferol (VITAMIN D3) 1000 UNITS CAPS Take by mouth daily.      . ciclopirox (PENLAC) 8 % solution Apply topically at bedtime. Apply over nail and surrounding skin. Apply daily over previous coat. After seven (7) days, may remove with alcohol and continue cycle. 6.6 mL 0  . doxycycline (VIBRA-TABS) 100 MG tablet Take 1 tablet (100 mg total) by mouth 2 (two) times daily. 28 tablet 0  . pravastatin (PRAVACHOL) 20 MG tablet Take 1 tablet (20 mg total) by mouth daily. 90 tablet 2  . valACYclovir (VALTREX) 500 MG tablet TAKE 1 TABLET BY MOUTH TWICE A DAY 60 tablet 0   No facility-administered medications prior to visit.     ROS Review of Systems  Constitutional: Negative for appetite change, fatigue and unexpected weight change.  HENT: Negative for congestion, nosebleeds, sneezing, sore throat and trouble swallowing.   Eyes: Negative for itching and visual disturbance.  Respiratory: Negative for cough.   Cardiovascular: Negative for chest pain, palpitations and leg swelling.  Gastrointestinal: Positive for blood in stool. Negative for abdominal distention, diarrhea and nausea.  Genitourinary: Negative for frequency and hematuria.  Musculoskeletal: Negative for back pain, gait problem, joint swelling and neck pain.  Skin: Negative for rash.  Neurological: Negative for dizziness, tremors, speech difficulty and weakness.  Psychiatric/Behavioral: Negative for agitation, dysphoric mood and sleep disturbance. The patient is not nervous/anxious.     Objective:  BP 118/80 (BP Location: Left Arm, Patient Position: Sitting, Cuff Size: Large)   Pulse 82    Temp 98.3 F (36.8 C) (Oral)   Ht 5\' 10"  (1.778 m)   Wt 214 lb (97.1 kg)   SpO2 99%   BMI 30.71 kg/m   BP Readings from Last 3 Encounters:  04/28/17 118/80  01/19/17 102/68  09/08/16 110/60    Wt Readings from Last 3 Encounters:  04/28/17 214 lb (97.1 kg)  01/19/17 213 lb (96.6 kg)  09/08/16 218 lb (98.9 kg)    Physical Exam  Constitutional: He is oriented to person, place, and time. He appears well-developed. No distress.  NAD  HENT:  Mouth/Throat: Oropharynx is clear and moist.  Eyes: Conjunctivae are normal. Pupils are equal, round, and reactive to light.  Neck: Normal range of motion. No JVD present. No thyromegaly present.  Cardiovascular: Normal rate, regular rhythm, normal heart sounds and intact distal pulses. Exam reveals no gallop and no friction rub.  No murmur heard. Pulmonary/Chest: Effort normal and breath sounds normal. No respiratory distress. He has no wheezes. He has no rales. He exhibits no tenderness.  Abdominal: Soft. Bowel sounds are normal. He exhibits no distension and no mass. There is no tenderness. There is no rebound and no guarding.  Genitourinary: Prostate normal. Rectal exam shows guaiac positive stool.  Musculoskeletal: Normal range of motion. He exhibits no edema or tenderness.  Lymphadenopathy:    He has no cervical adenopathy.  Neurological: He is alert and oriented to person, place, and time. He has normal reflexes. No cranial nerve deficit. He exhibits normal muscle tone. He displays a negative Romberg sign. Coordination and gait normal.  Skin: Skin is warm and  dry. No rash noted.  Psychiatric: He has a normal mood and affect. His behavior is normal. Judgment and thought content normal.  Stool G(+) brown Mouth corners are irritated   Procedure: Anoscopy Indication: blood in stool Risks and benefits were explained to pt in detail. He was placed in lateral decubitus position. Digital rectal exam was normal  . Stool was guaiac negative. .  Anoscope was introduced without difficulty. No masses. Upon withdrawal normal mucosa was observed. There was no lesions noted Impression: WNL Tolerated well. Complications - none.   Lab Results  Component Value Date   WBC 9.7 01/19/2017   HGB 14.1 01/19/2017   HCT 42.1 01/19/2017   PLT 205.0 01/19/2017   GLUCOSE 92 01/19/2017   CHOL 294 (H) 09/04/2016   TRIG 195.0 (H) 09/04/2016   HDL 47.20 09/04/2016   LDLDIRECT 198.4 11/28/2012   LDLCALC 208 (H) 09/04/2016   ALT 16 01/19/2017   AST 15 01/19/2017   NA 136 01/19/2017   K 3.7 01/19/2017   CL 100 01/19/2017   CREATININE 0.89 01/19/2017   BUN 11 01/19/2017   CO2 29 01/19/2017   TSH 2.51 01/19/2017   PSA 1.35 09/04/2016    Dg Chest 2 View  Result Date: 03/30/2017 CLINICAL DATA:  Follow-up pneumonia. Persistent fatigue and shortness of breath. EXAM: CHEST  2 VIEW COMPARISON:  Two-view chest x-ray 01/19/2017. FINDINGS: Heart size normal. Previously noted right middle lobe pneumonia has cleared. No residual airspace disease is present. There is no edema or effusion. The visualized soft tissues and bony thorax are unremarkable. IMPRESSION: Negative two view chest x-ray Electronically Signed   By: San Morelle M.D.   On: 03/30/2017 08:30    Assessment & Plan:   There are no diagnoses linked to this encounter. I am having Adrian Cruz maintain his Vitamin D3, ciclopirox, pravastatin, doxycycline, and valACYclovir.  No orders of the defined types were placed in this encounter.    Follow-up: No Follow-up on file.  Walker Kehr, MD

## 2017-04-28 NOTE — Telephone Encounter (Signed)
  Reason for Disposition . [1] MODERATE rectal bleeding (small blood clots, passing blood without stool, or toilet water turns red) AND [2] more than once a day  Answer Assessment - Initial Assessment Questions 1. APPEARANCE of BLOOD: "What color is it?" "Is it passed separately, on the surface of the stool, or mixed in with the stool?"      Red, in water and mixed in stool 2. AMOUNT: "How much blood was passed?"      Large amount 3. FREQUENCY: "How many times has blood been passed with the stools?"     4 days now then off/on before  4. ONSET: "When was the blood first seen in the stools?" (Days or weeks)     yesterday 5. DIARRHEA: "Is there also some diarrhea?" If so, ask: "How many diarrhea stools were passed in past 24 hours?"     no 6. CONSTIPATION: "Do you have constipation?" If so, "How bad is it?"     Maybe, not sure 7. RECURRENT SYMPTOMS: "Have you had blood in your stools before?" If so, ask: "When was the last time?" and "What happened that time?"    Yes awhile ago, just one time 8. BLOOD THINNERS: "Do you take any blood thinners?" (e.g., Coumadin/warfarin, Pradaxa/dabigatran, aspirin)    no 9. OTHER SYMPTOMS: "Do you have any other symptoms?"  (e.g., abdominal pain, vomiting, dizziness, fever)   Funny taste in mouth and thick tongue 10. PREGNANCY: "Is there any chance you are pregnant?" "When was your last menstrual period?"       na  Protocols used: RECTAL BLEEDING-A-AH

## 2017-04-28 NOTE — Patient Instructions (Addendum)
Use [employer] Peroxicare tooth paste  

## 2017-05-11 ENCOUNTER — Encounter: Payer: Self-pay | Admitting: Internal Medicine

## 2017-05-13 ENCOUNTER — Encounter: Payer: Self-pay | Admitting: Internal Medicine

## 2017-05-14 ENCOUNTER — Encounter: Payer: Self-pay | Admitting: Internal Medicine

## 2017-05-14 ENCOUNTER — Other Ambulatory Visit: Payer: Self-pay | Admitting: Internal Medicine

## 2017-05-14 MED ORDER — VALACYCLOVIR HCL 500 MG PO TABS: 500.0000 mg | ORAL_TABLET | Freq: Two times a day (BID) | ORAL | 5 refills | Status: DC

## 2017-05-20 ENCOUNTER — Encounter: Payer: Self-pay | Admitting: Internal Medicine

## 2017-05-24 ENCOUNTER — Encounter: Payer: Self-pay | Admitting: Internal Medicine

## 2017-05-24 ENCOUNTER — Ambulatory Visit (INDEPENDENT_AMBULATORY_CARE_PROVIDER_SITE_OTHER): Payer: 59 | Admitting: Internal Medicine

## 2017-05-24 DIAGNOSIS — K921 Melena: Secondary | ICD-10-CM | POA: Diagnosis not present

## 2017-05-24 DIAGNOSIS — R682 Dry mouth, unspecified: Secondary | ICD-10-CM | POA: Diagnosis not present

## 2017-05-24 DIAGNOSIS — K130 Diseases of lips: Secondary | ICD-10-CM | POA: Diagnosis not present

## 2017-05-24 DIAGNOSIS — N400 Enlarged prostate without lower urinary tract symptoms: Secondary | ICD-10-CM | POA: Diagnosis not present

## 2017-05-24 DIAGNOSIS — E785 Hyperlipidemia, unspecified: Secondary | ICD-10-CM | POA: Diagnosis not present

## 2017-05-24 HISTORY — DX: Dry mouth, unspecified: R68.2

## 2017-05-24 MED ORDER — TRIAMCINOLONE ACETONIDE 0.1 % EX OINT: 1.0000 "application " | TOPICAL_OINTMENT | Freq: Two times a day (BID) | CUTANEOUS | 2 refills | Status: DC

## 2017-05-24 MED ORDER — LORATADINE 10 MG PO TABS: 10.0000 mg | ORAL_TABLET | Freq: Every day | ORAL | 11 refills | Status: DC

## 2017-05-24 NOTE — Assessment & Plan Note (Signed)
Doing well 

## 2017-05-24 NOTE — Progress Notes (Addendum)
Subjective:  Patient ID: Adrian Cruz, male    DOB: [birth year]  Age: 51 y.o. MRN: [id number]  CC: No chief complaint on file.   HPI Adrian Cruz presents for lips and mouth irritation x 4 mo - it started after an abx - worse now. Not sure of the triggers. Wine makes it better... Stopped Pravachol. No other GI sx's  Outpatient Medications Prior to Visit  Medication Sig Dispense Refill  . Cholecalciferol (VITAMIN D3) 1000 UNITS CAPS Take by mouth daily.      . ciclopirox (PENLAC) 8 % solution Apply topically at bedtime. Apply over nail and surrounding skin. Apply daily over previous coat. After seven (7) days, may remove with alcohol and continue cycle. 6.6 mL 0  . doxycycline (VIBRA-TABS) 100 MG tablet Take 1 tablet (100 mg total) by mouth 2 (two) times daily. 28 tablet 0  . pravastatin (PRAVACHOL) 20 MG tablet Take 1 tablet (20 mg total) by mouth daily. 90 tablet 2  . valACYclovir (VALTREX) 500 MG tablet Take 1 tablet (500 mg total) by mouth 2 (two) times daily. 60 tablet 5   No facility-administered medications prior to visit.     ROS Review of Systems  Constitutional: Negative for appetite change, fatigue and unexpected weight change.  HENT: Negative for congestion, nosebleeds, sneezing, sore throat and trouble swallowing.   Eyes: Negative for itching and visual disturbance.  Respiratory: Negative for cough.   Cardiovascular: Negative for chest pain, palpitations and leg swelling.  Gastrointestinal: Negative for abdominal distention, blood in stool, diarrhea and nausea.  Genitourinary: Negative for frequency and hematuria.  Musculoskeletal: Negative for back pain, gait problem, joint swelling and neck pain.  Skin: Negative for rash.  Neurological: Negative for dizziness, tremors, speech difficulty and weakness.  Psychiatric/Behavioral: Negative for agitation, dysphoric mood and sleep disturbance. The patient is not nervous/anxious.     Objective:  BP 118/80 (BP  Location: Left Arm, Patient Position: Sitting, Cuff Size: Large)   Pulse 65   Temp 97.9 F (36.6 C) (Oral)   Ht 5\' 10"  (1.778 m)   Wt 217 lb (98.4 kg)   SpO2 99%   BMI 31.14 kg/m   BP Readings from Last 3 Encounters:  05/24/17 118/80  04/28/17 118/80  01/19/17 102/68    Wt Readings from Last 3 Encounters:  05/24/17 217 lb (98.4 kg)  04/28/17 214 lb (97.1 kg)  01/19/17 213 lb (96.6 kg)    Physical Exam  Constitutional: He is oriented to person, place, and time. He appears well-developed. No distress.  NAD  HENT:  Mouth/Throat: Oropharynx is clear and moist.  Eyes: Conjunctivae are normal. Pupils are equal, round, and reactive to light.  Neck: Normal range of motion. No JVD present. No thyromegaly present.  Cardiovascular: Normal rate, regular rhythm, normal heart sounds and intact distal pulses. Exam reveals no gallop and no friction rub.  No murmur heard. Pulmonary/Chest: Effort normal and breath sounds normal. No respiratory distress. He has no wheezes. He has no rales. He exhibits no tenderness.  Abdominal: Soft. Bowel sounds are normal. He exhibits no distension and no mass. There is no tenderness. There is no rebound and no guarding.  Musculoskeletal: Normal range of motion. He exhibits no edema or tenderness.  Lymphadenopathy:    He has no cervical adenopathy.  Neurological: He is alert and oriented to person, place, and time. He has normal reflexes. No cranial nerve deficit. He exhibits normal muscle tone. He displays a negative Romberg sign. Coordination and gait normal.  Skin: Skin is warm and dry. No rash noted.  Psychiatric: He has a normal mood and affect. His behavior is normal. Judgment and thought content normal.  slight discoloration of lip borders  Lab Results  Component Value Date   WBC 7.4 04/28/2017   HGB 15.0 04/28/2017   HCT 45.4 04/28/2017   PLT 276.0 04/28/2017   GLUCOSE 102 (H) 04/28/2017   CHOL 294 (H) 09/04/2016   TRIG 195.0 (H) 09/04/2016    HDL 47.20 09/04/2016   LDLDIRECT 198.4 11/28/2012   LDLCALC 208 (H) 09/04/2016   ALT 20 04/28/2017   AST 18 04/28/2017   NA 140 04/28/2017   K 4.0 04/28/2017   CL 103 04/28/2017   CREATININE 0.86 04/28/2017   BUN 10 04/28/2017   CO2 30 04/28/2017   TSH 2.51 01/19/2017   PSA 1.35 09/04/2016   INR 1.1 (H) 04/28/2017    Dg Chest 2 View  Result Date: 03/30/2017 CLINICAL DATA:  Follow-up pneumonia. Persistent fatigue and shortness of breath. EXAM: CHEST  2 VIEW COMPARISON:  Two-view chest x-ray 01/19/2017. FINDINGS: Heart size normal. Previously noted right middle lobe pneumonia has cleared. No residual airspace disease is present. There is no edema or effusion. The visualized soft tissues and bony thorax are unremarkable. IMPRESSION: Negative two view chest x-ray Electronically Signed   By: San Morelle M.D.   On: 03/30/2017 08:30    Assessment & Plan:   There are no diagnoses linked to this encounter. I am having Adrian Cruz maintain his Vitamin D3, ciclopirox, pravastatin, doxycycline, and valACYclovir.  No orders of the defined types were placed in this encounter.    Follow-up: No Follow-up on file.  Walker Kehr, MD

## 2017-05-24 NOTE — Assessment & Plan Note (Signed)
?

## 2017-05-24 NOTE — Assessment & Plan Note (Addendum)
x3 mo ?etiology Water with lemon Try organic wine without sulfites Claritin one a day, Vitamin B complex 1 a day Try OTC Zantac 1 a day Gluten free trial for 4-6 weeks. OK to use gluten-free bread and gluten-free pasta.

## 2017-05-24 NOTE — Patient Instructions (Addendum)
Water with lemon Try organic wine without sulfites Claritin one a day, Vitamin B complex 1 a day Try OTC Zantac 1 a day      Gluten free trial for 4-6 weeks. OK to use gluten-free bread and gluten-free pasta.    Gluten-Free Diet for Celiac Disease, Adult The gluten-free diet includes all foods that do not contain gluten. Gluten is a protein that is found in wheat, rye, barley, and some other grains. Following the gluten-free diet is the only treatment for people with celiac disease. It helps to prevent damage to the intestines and improves or eliminates the symptoms of celiac disease. Following the gluten-free diet requires some planning. It can be challenging at first, but it gets easier with time and practice. There are more gluten-free options available today than ever before. If you need help finding gluten-free foods or if you have questions, talk with your diet and nutrition specialist (registered dietitian) or your health care provider. What do I need to know about a gluten-free diet?  All fruits, vegetables, and meats are safe to eat and do not contain gluten.  When grocery shopping, start by shopping in the produce, meat, and dairy sections. These sections are more likely to contain gluten-free foods. Then move to the aisles that contain packaged foods if you need to.  Read all food labels. Gluten is often added to foods. Always check the ingredient list and look for warnings, such as "may contain gluten."  Talk with your dietitian or health care provider before taking a gluten-free multivitamin or mineral supplement.  Be aware of gluten-free foods having contact with foods that contain gluten (cross-contamination). This can happen at home and with any processed foods. ? Talk with your health care provider or dietitian about how to reduce the risk of cross-contamination in your home. ? If you have questions about how a food is processed, ask the manufacturer. What key words help  to identify gluten? Foods that list any of these key words on the label usually contain gluten:  Wheat, flour, enriched flour, bromated flour, white flour, durum flour, graham flour, phosphated flour, self-rising flour, semolina, farina, barley (malt), rye, and oats.  Starch, dextrin, modified food starch, or cereal.  Thickening, fillers, or emulsifiers.  Malt flavoring, malt extract, or malt syrup.  Hydrolyzed vegetable protein.  In the [place] packaged foods that are gluten-free are required to be labeled "GF." These foods should be easy to identify and are safe to eat. In the [place] food companies are also required to list common food allergens, including wheat, on their labels. Recommended foods Grains  Amaranth, bean flours, 100% buckwheat flour, corn, millet, nut flours or nut meals, GF oats, quinoa, rice, sorghum, teff, rice wafers, pure cornmeal tortillas, popcorn, and hot cereals made from cornmeal. Hominy, rice, wild rice. Some Asian rice noodles or bean noodles. Arrowroot starch, corn bran, corn flour, corn germ, cornmeal, corn starch, potato flour, potato starch flour, and rice bran. Plain, brown, and sweet rice flours. Rice polish, soy flour, and tapioca starch. Vegetables  All plain fresh, frozen, and canned vegetables. Fruits  All plain fresh, frozen, canned, and dried fruits, and 100% fruit juices. Meats and other protein foods  All fresh beef, pork, poultry, fish, seafood, and eggs. Fish canned in water, oil, brine, or vegetable broth. Plain nuts and seeds, peanut butter. Some lunch meat and some frankfurters. Dried beans, dried peas, and lentils. Dairy  Fresh plain, dry, evaporated, or condensed milk. Cream, butter, sour cream, whipping cream,  and most yogurts. Unprocessed cheese, most processed cheeses, some cottage cheese, some cream cheeses. Beverages  Coffee, tea, most herbal teas. Carbonated beverages and some root beers. Wine, sake, and distilled spirits, such as  gin, vodka, and whiskey. Most hard ciders. Fats and oils  Butter, margarine, vegetable oil, hydrogenated butter, olive oil, shortening, lard, cream, and some mayonnaise. Some commercial salad dressings. Olives. Sweets and desserts  Sugar, honey, some syrups, molasses, jelly, and jam. Plain hard candy, marshmallows, and gumdrops. Pure cocoa powder. Plain chocolate. Custard and some pudding mixes. Gelatin desserts, sorbets, frozen ice pops, and sherbet. Cake, cookies, and other desserts prepared with allowed flours. Some commercial ice creams. Cornstarch, tapioca, and rice puddings. Seasoning and other foods  Some canned or frozen soups. Monosodium glutamate (MSG). Cider, rice, and wine vinegar. Baking soda and baking powder. Cream of tartar. Baking and nutritional yeast. Certain soy sauces made without wheat (ask your dietitian about specific brands that are allowed). Nuts, coconut, and chocolate. Salt, pepper, herbs, spices, flavoring extracts, imitation or artificial flavorings, natural flavorings, and food colorings. Some medicines and supplements. Some lip glosses and other cosmetics. Rice syrups. The items listed may not be a complete list. Talk with your dietitian about what dietary choices are best for you. Foods to avoid Grains  Barley, bran, bulgur, couscous, cracked wheat, [place], farro, graham, malt, matzo, semolina, wheat germ, and all wheat and rye cereals including spelt and kamut. Cereals containing malt as a flavoring, such as rice cereal. Noodles, spaghetti, macaroni, most packaged rice mixes, and all mixes containing wheat, rye, barley, or triticale. Vegetables  Most creamed vegetables and most vegetables canned in sauces. Some commercially prepared vegetables and salads. Fruits  Thickened or prepared fruits and some pie fillings. Some fruit snacks and fruit roll-ups. Meats and other protein foods  Any meat or meat alternative containing wheat, rye, barley, or gluten  stabilizers. These are often marinated or packaged meats and lunch meats. Bread-containing products, such as Swiss steak, croquettes, meatballs, and meatloaf. Most tuna canned in vegetable broth and [place] with hydrolyzed vegetable protein (HVP) injected as part of the basting. Seitan. Imitation fish. Eggs in sauces made from ingredients to avoid. Dairy  Commercial chocolate milk drinks and malted milk. Some non-dairy creamers. Any cheese product containing ingredients to avoid. Beverages  Certain cereal beverages. Beer, ale, malted milk, and some root beers. Some hard ciders. Some instant flavored coffees. Some herbal teas made with barley or with barley malt added. Fats and oils  Some commercial salad dressings. Sour cream containing modified food starch. Sweets and desserts  Some toffees. Chocolate-coated nuts (may be rolled in wheat flour) and some commercial candies and candy bars. Most cakes, cookies, donuts, pastries, and other baked goods. Some commercial ice cream. Ice cream cones. Commercially prepared mixes for cakes, cookies, and other desserts. Bread pudding and other puddings thickened with flour. Products containing brown rice syrup made with barley malt enzyme. Desserts and sweets made with malt flavoring. Seasoning and other foods  Some curry powders, some dry seasoning mixes, some gravy extracts, some meat sauces, some ketchups, some prepared mustards, and horseradish. Certain soy sauces. Malt vinegar. Bouillon and bouillon cubes that contain HVP. Some chip dips, and some chewing gum. Yeast extract. Brewer's yeast. Caramel color. Some medicines and supplements. Some lip glosses and other cosmetics. The items listed may not be a complete list. Talk with your dietitian about what dietary choices are best for you. Summary  Gluten is a protein that is found in wheat,  rye, barley, and some other grains. The gluten-free diet includes all foods that do not contain gluten.  If you need  help finding gluten-free foods or if you have questions, talk with your diet and nutrition specialist (registered dietitian) or your health care provider.  Read all food labels. Gluten is often added to foods. Always check the ingredient list and look for warnings, such as "may contain gluten." This information is not intended to replace advice given to you by your health care provider. Make sure you discuss any questions you have with your health care provider. Document Released: 05/11/2005 Document Revised: 02/24/2016 Document Reviewed: 02/24/2016 Elsevier Interactive Patient Education  2018 [employer].

## 2017-05-24 NOTE — Assessment & Plan Note (Signed)
Triamc oint 

## 2017-05-24 NOTE — Assessment & Plan Note (Signed)
Pravastatin is on hold due to oral irritation - not better

## 2017-05-25 HISTORY — PX: COLONOSCOPY: SHX174

## 2017-06-02 ENCOUNTER — Other Ambulatory Visit (INDEPENDENT_AMBULATORY_CARE_PROVIDER_SITE_OTHER): Payer: 59

## 2017-06-02 DIAGNOSIS — K921 Melena: Secondary | ICD-10-CM

## 2017-06-02 DIAGNOSIS — R682 Dry mouth, unspecified: Secondary | ICD-10-CM | POA: Diagnosis not present

## 2017-06-02 DIAGNOSIS — K130 Diseases of lips: Secondary | ICD-10-CM

## 2017-06-02 LAB — CBC WITH DIFFERENTIAL/PLATELET
BASOS PCT: 0.7 % (ref 0.0–3.0)
Basophils Absolute: 0 10*3/uL (ref 0.0–0.1)
EOS PCT: 3.2 % (ref 0.0–5.0)
Eosinophils Absolute: 0.2 10*3/uL (ref 0.0–0.7)
HCT: 43.3 % (ref 39.0–52.0)
Hemoglobin: 14.6 g/dL (ref 13.0–17.0)
LYMPHS ABS: 3 10*3/uL (ref 0.7–4.0)
Lymphocytes Relative: 42.2 % (ref 12.0–46.0)
MCHC: 33.7 g/dL (ref 30.0–36.0)
MCV: 86.4 fl (ref 78.0–100.0)
MONO ABS: 0.9 10*3/uL (ref 0.1–1.0)
MONOS PCT: 12.6 % — AB (ref 3.0–12.0)
NEUTROS ABS: 2.9 10*3/uL (ref 1.4–7.7)
NEUTROS PCT: 41.3 % — AB (ref 43.0–77.0)
Platelets: 261 10*3/uL (ref 150.0–400.0)
RBC: 5.01 Mil/uL (ref 4.22–5.81)
RDW: 13.6 % (ref 11.5–15.5)
WBC: 7 10*3/uL (ref 4.0–10.5)

## 2017-06-02 LAB — H. PYLORI ANTIBODY, IGG: H PYLORI IGG: POSITIVE — AB

## 2017-06-02 LAB — HEMOGLOBIN A1C: Hgb A1c MFr Bld: 5.5 % (ref 4.6–6.5)

## 2017-06-02 LAB — VITAMIN B12: Vitamin B-12: 657 pg/mL (ref 211–911)

## 2017-06-02 LAB — TSH: TSH: 3.02 u[IU]/mL (ref 0.35–4.50)

## 2017-06-03 ENCOUNTER — Encounter: Payer: Self-pay | Admitting: Internal Medicine

## 2017-06-03 ENCOUNTER — Other Ambulatory Visit: Payer: Self-pay | Admitting: Internal Medicine

## 2017-06-03 MED ORDER — AMOXICILLIN 500 MG PO CAPS: 1000.0000 mg | ORAL_CAPSULE | Freq: Two times a day (BID) | ORAL | 0 refills | Status: DC

## 2017-06-03 MED ORDER — PANTOPRAZOLE SODIUM 40 MG PO TBEC: 40.0000 mg | DELAYED_RELEASE_TABLET | Freq: Two times a day (BID) | ORAL | 1 refills | Status: DC

## 2017-06-03 MED ORDER — CLARITHROMYCIN 500 MG PO TABS: 500.0000 mg | ORAL_TABLET | Freq: Two times a day (BID) | ORAL | 0 refills | Status: DC

## 2017-06-04 ENCOUNTER — Encounter: Payer: Self-pay | Admitting: Internal Medicine

## 2017-06-05 LAB — ALLERGEN FOOD PROFILE SPECIFIC IGE
Allergen Apple, IgE: <0.1 kU/L
Allergen Corn, IgE: <0.1 kU/L
Egg White IgE: <0.1 kU/L
IgE (Immunoglobulin E), Serum: 417 IU/mL — ABNORMAL HIGH (ref 0–100)
Orange: <0.1 kU/L
Shrimp IgE: <0.1 kU/L
Soybean IgE: <0.1 kU/L
Tuna: <0.1 kU/L

## 2017-06-07 ENCOUNTER — Encounter: Payer: Self-pay | Admitting: Internal Medicine

## 2017-06-07 NOTE — Telephone Encounter (Signed)
Copied from [place] [id number]. Topic: Quick Communication - Rx Refill/Question >> Jun 07, 2017 11:02 AM Ahmed Prima L wrote: Medication: amoxicillin (AMOXIL) 500 MG capsule & pantoprazole (PROTONIX) 40 MG tablet &  clarithromycin (BIAXIN) 500 MG tablet  Has the patient contacted their pharmacy? Yes & both pharmacy's that are in his chart, the meds are not at the pharmacy    (Agent: If no, request that the patient contact the pharmacy for the refill.)   Preferred Pharmacy (with phone number or street name): [id number] - [place], [place]: Please be advised that RX refills may take up to 3 business days. We ask that you follow-up with your pharmacy.

## 2017-06-08 ENCOUNTER — Other Ambulatory Visit: Payer: Self-pay | Admitting: Internal Medicine

## 2017-06-08 MED ORDER — PANTOPRAZOLE SODIUM 40 MG PO TBEC: 40.0000 mg | DELAYED_RELEASE_TABLET | Freq: Two times a day (BID) | ORAL | 1 refills | Status: DC

## 2017-06-08 MED ORDER — AMOXICILLIN 500 MG PO CAPS: 1000.0000 mg | ORAL_CAPSULE | Freq: Two times a day (BID) | ORAL | 0 refills | Status: DC

## 2017-06-08 MED ORDER — CLARITHROMYCIN 500 MG PO TABS: 500.0000 mg | ORAL_TABLET | Freq: Two times a day (BID) | ORAL | 0 refills | Status: AC

## 2017-06-09 ENCOUNTER — Ambulatory Visit: Payer: 59 | Admitting: Internal Medicine

## 2017-06-14 ENCOUNTER — Ambulatory Visit: Payer: 59 | Admitting: Internal Medicine

## 2017-06-22 ENCOUNTER — Telehealth: Payer: Self-pay | Admitting: Internal Medicine

## 2017-06-22 ENCOUNTER — Encounter: Payer: Self-pay | Admitting: Physician Assistant

## 2017-06-22 ENCOUNTER — Ambulatory Visit (INDEPENDENT_AMBULATORY_CARE_PROVIDER_SITE_OTHER): Payer: 59 | Admitting: Physician Assistant

## 2017-06-22 VITALS — BP 106/60 | HR 80 | Ht 70.0 in | Wt 216.0 lb

## 2017-06-22 DIAGNOSIS — Z1211 Encounter for screening for malignant neoplasm of colon: Secondary | ICD-10-CM

## 2017-06-22 DIAGNOSIS — Z86010 Personal history of colonic polyps: Secondary | ICD-10-CM

## 2017-06-22 DIAGNOSIS — B370 Candidal stomatitis: Secondary | ICD-10-CM | POA: Diagnosis not present

## 2017-06-22 DIAGNOSIS — K625 Hemorrhage of anus and rectum: Secondary | ICD-10-CM

## 2017-06-22 MED ORDER — NYSTATIN 100000 UNIT/ML MT SUSP: OROMUCOSAL | 1 refills | Status: DC

## 2017-06-22 NOTE — Progress Notes (Signed)
Assessment and plan reviewed 

## 2017-06-22 NOTE — Telephone Encounter (Signed)
Pts wife states pt is having rectal bleeding and requesting pt be seen but they only want to see Dr. Henrene Pastor. Explained we could see him today with a PA but they declined, again only want to see Henrene Pastor. Instructed her to take him to the ER if he needed to be see prior to his scheduled appt with Henrene Pastor. Pt placed on cancellation list. Wife aware.

## 2017-06-22 NOTE — Patient Instructions (Signed)
We sent a prescription to [facility]. 1. Mycostatin oral suspension.  You have been scheduled for a colonoscopy. Please follow written instructions given to you at your visit today.  We have given  You the prep for the colonoscopy.  If you use inhalers (even only as needed), please bring them with you on the day of your procedure.

## 2017-06-22 NOTE — Progress Notes (Signed)
Subjective:    Patient ID: Adrian Cruz, male    DOB: 05/31/1965, 52 y.o.   MRN: [id number]  HPI Adrian Cruz is a 52 year old male, known to Dr. Marina Cruz who comes in today with complaints of rectal bleeding. Patient had undergone colonoscopy in October 2015 for screening, he had a 2 mm polyp in the sigmoid colon which was removed and found to be a tubular adenoma, and was noted to have internal hemorrhoids. He is indicated for 5 year interval follow-up. Patient is generally in good health does have history of prostatitis, and hyperlipidemia. He says prior to having the colonoscopy he had never had any problems with rectal bleeding. About 2 years ago he had one episode of noticing some bright red blood with a bowel movement. A few months later he may have noticed a little bit of blood on one occasion again. In December 2018 he says he started noticing blood more regularly with his bowel movements. Again this is bright red blood both on the tissue and mixed with bowel movements. He started taking a stool softener though he says he wasn't having any constipation or excessive straining. He didn't see any blood for a few weeks, He started noticing blood again last Friday and saw small amount of bright red blood yesterday and today again with bowel movements and on the tissue. No complaints of rectal pain or discomfort, no complaints of abdominal discomfort or pain, no changes in bowel habits. He was seen by Dr. Posey Rea Cruz earlier this month with complaints of on and sensation of burning or discomfort in his mouth and some burning of his lips which is been bothering him for a few months. On further questioning this seemed to occur after he had taken an antibiotic this fall. He seems  frustrated with the symptoms. He was tested for H. Pylori, was positive and is just completed a course of antibiotics. He still taking Protonix and wonders if he needs that. He feels that this is causing some mild  dyspeptic symptoms. He denies any problems with heartburn or indigestion, no dysphagia or odynophagia. He did have B12 level checked on 06/02/2017 which was also within normal limits food allergen profile was negative. He also had recent rectal exam done per Dr. Beverely Cruz  which was negative,but blood was noted on exam.  Review of Systems Pertinent positive and negative review of systems were noted in the above HPI section.  All other review of systems was otherwise negative.  Outpatient Encounter Medications as of 06/22/2017  Medication Sig  . Cholecalciferol (VITAMIN D3) 1000 UNITS CAPS Take by mouth daily.    . ciclopirox (PENLAC) 8 % solution Apply topically at bedtime. Apply over nail and surrounding skin. Apply daily over previous coat. After seven (7) days, may remove with alcohol and continue cycle.  . loratadine (CLARITIN) 10 MG tablet Take 1 tablet (10 mg total) by mouth daily.  . pantoprazole (PROTONIX) 40 MG tablet Take 1 tablet (40 mg total) by mouth 2 (two) times daily.  . pravastatin (PRAVACHOL) 20 MG tablet Take 1 tablet (20 mg total) by mouth daily.  Marland Kitchen triamcinolone ointment (KENALOG) 0.1 % Apply 1 application topically 2 (two) times daily.  . valACYclovir (VALTREX) 500 MG tablet Take 1 tablet (500 mg total) by mouth 2 (two) times daily.  Marland Kitchen nystatin (MYCOSTATIN) 100000 UNIT/ML suspension Take 5 cc's by mouth, Swish and swallow.  . [DISCONTINUED] amoxicillin (AMOXIL) 500 MG capsule Take 2 capsules (1,000 mg total) by mouth 2 (two) times  daily.   No facility-administered encounter medications on file as of 06/22/2017.    Allergies  Allergen Reactions  . Lipitor [Atorvastatin]     myalgias  . Simvastatin     REACTION: rash   Patient Active Problem List   Diagnosis Date Noted  . Dry mouth, unspecified 05/24/2017  . Hematochezia 04/28/2017  . Angular stomatitis 04/28/2017  . Fever chills 01/19/2017  . Prostatism 11/30/2012  . Otitis externa 03/17/2012  . Myalgia  12/11/2011  . Well adult exam 01/06/2011  . PROSTATITIS, ACUTE 10/30/2009  . HEMATURIA, MICROSCOPIC, HX OF 12/10/2008  . BURSITIS, KNEE 03/15/2007  . Dyslipidemia 02/03/2007   Social History   Socioeconomic History  . Marital status: Married    Spouse name: Not on file  . Number of children: Not on file  . Years of education: Not on file  . Highest education level: Not on file  Social Needs  . Financial resource strain: Not on file  . Food insecurity - worry: Not on file  . Food insecurity - inability: Not on file  . Transportation needs - medical: Not on file  . Transportation needs - non-medical: Not on file  Occupational History  . Not on file  Tobacco Use  . Smoking status: Never Smoker  . Smokeless tobacco: Never Used  Substance and Sexual Activity  . Alcohol use: Yes    Comment: a little  . Drug use: No  . Sexual activity: Yes  Other Topics Concern  . Not on file  Social History Narrative  . Not on file    [name] family history includes Hypertension in his father.      Objective:    Vitals:   06/22/17 1400  BP: 106/60  Pulse: 80    Physical Exam  Well-developed middle-aged white male in no acute distress, blood pressure 106/60 pulse 80, height 5 foot 10, weight 216, BMI 30.9.HEENT nontraumatic normocephalic EOMI PERRLA sclera anicteric oropharynx shows some mild chelosis of the lips, tongue has a whitish coating. Not further examined today,       Assessment & Plan:   #40 52 year old white male with recent intermittent rectal bleeding with bright red blood on the tissue and mixed with bowel movements. Bleeding is likely secondary to previously documented internal hemorrhoids, however cannot rule out proctitis or colonic lesion. #2 history of tubular adenomatous colon polyp-October 2015, was indicated for 5 year interval follow-up #3 recent treatment for positive H. Pylori antibody #4 oral candidiasis  Plan; patient will be given a course of  Mycostatin oral suspension 5 mL swish and swallow 4 times daily 14 days, he may repeat 1. If he has a partial response but doesn't clear consider course of Diflucan Long discussion with the patient today, regarding internal hemorrhoids, management of rectal bleeding, colonoscopy etc. Patient will be scheduled for colonoscopy with Dr. Marina Cruz. Procedure was discussed in detail with patient including indications, risks and benefits and he is agreeable to proceed. We also briefly discussed in office hemorrhoidal banding, if colonoscopy is otherwise negative and patient wants definitive treatment for internal hemorrhoids. He will stop Protonix.  Kacy Conely S Nolita Kutter PA-C 06/22/2017   Cc: Plotnikov, Georgina Quint, MD

## 2017-06-22 NOTE — Telephone Encounter (Signed)
Spoke to spouse. They are coming in today. I put them on Amy Esterwood's available appointment today at 2pm.

## 2017-06-23 ENCOUNTER — Other Ambulatory Visit: Payer: Self-pay | Admitting: *Deleted

## 2017-06-24 ENCOUNTER — Encounter: Payer: 59 | Admitting: Internal Medicine

## 2017-07-08 ENCOUNTER — Ambulatory Visit: Payer: 59 | Admitting: Internal Medicine

## 2017-07-15 ENCOUNTER — Ambulatory Visit (AMBULATORY_SURGERY_CENTER): Payer: 59 | Admitting: Internal Medicine

## 2017-07-15 ENCOUNTER — Other Ambulatory Visit: Payer: Self-pay

## 2017-07-15 ENCOUNTER — Encounter: Payer: Self-pay | Admitting: Internal Medicine

## 2017-07-15 VITALS — BP 127/88 | HR 65 | Temp 97.9°F | Resp 10 | Ht 70.0 in | Wt 216.0 lb

## 2017-07-15 DIAGNOSIS — K625 Hemorrhage of anus and rectum: Secondary | ICD-10-CM | POA: Diagnosis present

## 2017-07-15 DIAGNOSIS — Z86010 Personal history of colonic polyps: Secondary | ICD-10-CM | POA: Diagnosis not present

## 2017-07-15 MED ORDER — SODIUM CHLORIDE 0.9 % IV SOLN: 500.0000 mL | Freq: Once | INTRAVENOUS | Status: DC

## 2017-07-15 NOTE — Op Note (Signed)
[place] Patient Name: Adrian Cruz Procedure Date: 07/15/2017 3:29 PM MRN: [id number] Endoscopist: Docia Chuck. Henrene Pastor , MD Age: 52 Referring MD:  Date of Birth: 10-24-1965 Gender: Male Account #: [id number] Procedure:                Colonoscopy Indications:              Rectal bleeding. Prior colonoscopy October 2015                            with small tubular adenoma Medicines:                Monitored Anesthesia Care Procedure:                Pre-Anesthesia Assessment:                           - Prior to the procedure, a History and Physical                            was performed, and patient medications and                            allergies were reviewed. The patient's tolerance of                            previous anesthesia was also reviewed. The risks                            and benefits of the procedure and the sedation                            options and risks were discussed with the patient.                            All questions were answered, and informed consent                            was obtained. Prior Anticoagulants: The patient has                            taken no previous anticoagulant or antiplatelet                            agents. ASA Grade Assessment: II - A patient with                            mild systemic disease. After reviewing the risks                            and benefits, the patient was deemed in                            satisfactory condition to undergo the procedure.  After obtaining informed consent, the colonoscope                            was passed under direct vision. Throughout the                            procedure, the patient's blood pressure, pulse, and                            oxygen saturations were monitored continuously. The                            Colonoscope was introduced through the anus and                            advanced to the the cecum,  identified by                            appendiceal orifice and ileocecal valve. The                            ileocecal valve, appendiceal orifice, and rectum                            were photographed. The quality of the bowel                            preparation was excellent. The colonoscopy was                            performed without difficulty. The patient tolerated                            the procedure well. The bowel preparation used was                            SUPREP. Scope In: 3:41:09 PM Scope Out: 3:52:24 PM Scope Withdrawal Time: 0 hours 9 minutes 23 seconds  Total Procedure Duration: 0 hours 11 minutes 15 seconds  Findings:                 Multiple diverticula were found in the sigmoid                            colon.                           Internal hemorrhoids were found during                            retroflexion. The hemorrhoids were small.                           The exam was otherwise without abnormality on  direct and retroflexion views. Complications:            No immediate complications. Estimated blood loss:                            None. Estimated Blood Loss:     Estimated blood loss: none. Impression:               - Diverticulosis in the sigmoid colon.                           - Internal hemorrhoids.                           - The examination was otherwise normal on direct                            and retroflexion views.                           - No specimens collected. Recommendation:           - Repeat colonoscopy in 5 years for surveillance                            (history of adenomatous polyp).                           - Patient has a contact number available for                            emergencies. The signs and symptoms of potential                            delayed complications were discussed with the                            patient. Return to normal activities tomorrow.                             Written discharge instructions were provided to the                            patient.                           - Resume previous diet.                           - Continue present medications. Docia Chuck. Henrene Pastor, MD 07/15/2017 3:56:47 PM This report has been signed electronically.

## 2017-07-15 NOTE — Progress Notes (Signed)
Report to PACU, RN, vss, BBS= Clear.  

## 2017-07-15 NOTE — Patient Instructions (Signed)
HANDOUTS GIVEN : DIVERTICULOSIS AND HEMORRHOIDS.  YOU HAD AN ENDOSCOPIC PROCEDURE TODAY AT [facility]:   Refer to the procedure report that was given to you for any specific questions about what was found during the examination.  If the procedure report does not answer your questions, please call your gastroenterologist to clarify.  If you requested that your care partner not be given the details of your procedure findings, then the procedure report has been included in a sealed envelope for you to review at your convenience later.  YOU SHOULD EXPECT: Some feelings of bloating in the abdomen. Passage of more gas than usual.  Walking can help get rid of the air that was put into your GI tract during the procedure and reduce the bloating. If you had a lower endoscopy (such as a colonoscopy or flexible sigmoidoscopy) you may notice spotting of blood in your stool or on the toilet paper. If you underwent a bowel prep for your procedure, you may not have a normal bowel movement for a few days.  Please Note:  You might notice some irritation and congestion in your nose or some drainage.  This is from the oxygen used during your procedure.  There is no need for concern and it should clear up in a day or so.  SYMPTOMS TO REPORT IMMEDIATELY:   Following lower endoscopy (colonoscopy or flexible sigmoidoscopy):  Excessive amounts of blood in the stool  Significant tenderness or worsening of abdominal pains  Swelling of the abdomen that is new, acute  Fever of 100F or higher   For urgent or emergent issues, a gastroenterologist can be reached at any hour by calling [phone].   DIET:  We do recommend a small meal at first, but then you may proceed to your regular diet.  Drink plenty of fluids but you should avoid alcoholic beverages for 24 hours.  ACTIVITY:  You should plan to take it easy for the rest of today and you should NOT DRIVE or use heavy machinery until tomorrow (because  of the sedation medicines used during the test).    FOLLOW UP: Our staff will call the number listed on your records the next business day following your procedure to check on you and address any questions or concerns that you may have regarding the information given to you following your procedure. If we do not reach you, we will leave a message.  However, if you are feeling well and you are not experiencing any problems, there is no need to return our call.  We will assume that you have returned to your regular daily activities without incident.  If any biopsies were taken you will be contacted by phone or by letter within the next 1-3 weeks.  Please call us at [phone] if you have not heard about the biopsies in 3 weeks.    SIGNATURES/CONFIDENTIALITY: You and/or your care partner have signed paperwork which will be entered into your electronic medical record.  These signatures attest to the fact that that the information above on your After Visit Summary has been reviewed and is understood.  Full responsibility of the confidentiality of this discharge information lies with you and/or your care-partner.

## 2017-07-15 NOTE — Progress Notes (Signed)
I have reviewed the patient's medical history in detail and updated the computerized patient record.

## 2017-07-16 ENCOUNTER — Telehealth: Payer: Self-pay

## 2017-07-16 NOTE — Telephone Encounter (Signed)
  Follow up Call-  Call back number 07/15/2017  Post procedure Call Back phone  # [phone]  Permission to leave phone message Yes  Some recent data might be hidden     Patient questions:  Do you have a fever, pain , or abdominal swelling? No. Pain Score  0 *  Have you tolerated food without any problems? Yes.    Have you been able to return to your normal activities? Yes.    Do you have any questions about your discharge instructions: Diet   No. Medications  No. Follow up visit  No.  Do you have questions or concerns about your Care? No.  Actions: * If pain score is 4 or above: No action needed, pain <4.

## 2017-07-25 ENCOUNTER — Encounter: Payer: Self-pay | Admitting: Internal Medicine

## 2017-07-27 ENCOUNTER — Other Ambulatory Visit: Payer: Self-pay | Admitting: Internal Medicine

## 2017-07-27 MED ORDER — FLUCONAZOLE 100 MG PO TABS: ORAL_TABLET | ORAL | 1 refills | Status: DC

## 2017-08-12 ENCOUNTER — Encounter: Payer: Self-pay | Admitting: Internal Medicine

## 2017-08-17 ENCOUNTER — Other Ambulatory Visit: Payer: Self-pay | Admitting: Internal Medicine

## 2017-08-17 DIAGNOSIS — K1230 Oral mucositis (ulcerative), unspecified: Principal | ICD-10-CM

## 2017-08-17 DIAGNOSIS — K121 Other forms of stomatitis: Secondary | ICD-10-CM

## 2017-09-22 ENCOUNTER — Encounter: Payer: 59 | Admitting: Internal Medicine

## 2017-10-26 ENCOUNTER — Encounter: Payer: Self-pay | Admitting: Internal Medicine

## 2017-10-28 ENCOUNTER — Other Ambulatory Visit: Payer: Self-pay | Admitting: Internal Medicine

## 2017-10-28 DIAGNOSIS — Z0000 Encounter for general adult medical examination without abnormal findings: Secondary | ICD-10-CM

## 2017-11-02 ENCOUNTER — Other Ambulatory Visit (INDEPENDENT_AMBULATORY_CARE_PROVIDER_SITE_OTHER): Payer: 59

## 2017-11-02 DIAGNOSIS — Z0000 Encounter for general adult medical examination without abnormal findings: Secondary | ICD-10-CM

## 2017-11-02 LAB — URINALYSIS
BILIRUBIN URINE: NEGATIVE
Hgb urine dipstick: NEGATIVE
Ketones, ur: NEGATIVE
LEUKOCYTES UA: NEGATIVE
NITRITE: NEGATIVE
PH: 6 (ref 5.0–8.0)
SPECIFIC GRAVITY, URINE: 1.02 (ref 1.000–1.030)
Total Protein, Urine: NEGATIVE
Urine Glucose: NEGATIVE
Urobilinogen, UA: 0.2 (ref 0.0–1.0)

## 2017-11-02 LAB — LIPID PANEL
CHOL/HDL RATIO: 6
Cholesterol: 268 mg/dL — ABNORMAL HIGH (ref 0–200)
HDL: 44.5 mg/dL (ref >39.00)
LDL CALC: 189 mg/dL — AB (ref 0–99)
NonHDL: 223.11
TRIGLYCERIDES: 173 mg/dL — AB (ref 0.0–149.0)
VLDL: 34.6 mg/dL (ref 0.0–40.0)

## 2017-11-02 LAB — CBC WITH DIFFERENTIAL/PLATELET
BASOS PCT: 0.8 % (ref 0.0–3.0)
Basophils Absolute: 0.1 10*3/uL (ref 0.0–0.1)
EOS ABS: 0.3 10*3/uL (ref 0.0–0.7)
EOS PCT: 4.4 % (ref 0.0–5.0)
HCT: 43.9 % (ref 39.0–52.0)
HEMOGLOBIN: 14.9 g/dL (ref 13.0–17.0)
LYMPHS PCT: 42.9 % (ref 12.0–46.0)
Lymphs Abs: 3 10*3/uL (ref 0.7–4.0)
MCHC: 33.9 g/dL (ref 30.0–36.0)
MCV: 85.6 fl (ref 78.0–100.0)
MONOS PCT: 11 % (ref 3.0–12.0)
Monocytes Absolute: 0.8 10*3/uL (ref 0.1–1.0)
Neutro Abs: 2.9 10*3/uL (ref 1.4–7.7)
Neutrophils Relative %: 40.9 % — ABNORMAL LOW (ref 43.0–77.0)
Platelets: 277 10*3/uL (ref 150.0–400.0)
RBC: 5.13 Mil/uL (ref 4.22–5.81)
RDW: 12.8 % (ref 11.5–15.5)
WBC: 7.1 10*3/uL (ref 4.0–10.5)

## 2017-11-02 LAB — BASIC METABOLIC PANEL
BUN: 13 mg/dL (ref 6–23)
CHLORIDE: 105 meq/L (ref 96–112)
CO2: 27 mEq/L (ref 19–32)
Calcium: 8.9 mg/dL (ref 8.4–10.5)
Creatinine, Ser: 0.87 mg/dL (ref 0.40–1.50)
GFR: 97.12 mL/min (ref >60.00)
GLUCOSE: 99 mg/dL (ref 70–99)
POTASSIUM: 4.2 meq/L (ref 3.5–5.1)
Sodium: 140 mEq/L (ref 135–145)

## 2017-11-02 LAB — HEPATIC FUNCTION PANEL
ALK PHOS: 48 U/L (ref 39–117)
ALT: 27 U/L (ref 0–53)
AST: 24 U/L (ref 0–37)
Albumin: 4.1 g/dL (ref 3.5–5.2)
BILIRUBIN DIRECT: 0.1 mg/dL (ref 0.0–0.3)
BILIRUBIN TOTAL: 0.6 mg/dL (ref 0.2–1.2)
Total Protein: 6.8 g/dL (ref 6.0–8.3)

## 2017-11-02 LAB — TSH: TSH: 3.65 u[IU]/mL (ref 0.35–4.50)

## 2017-11-02 LAB — PSA: PSA: 1.5 ng/mL (ref 0.10–4.00)

## 2017-11-08 ENCOUNTER — Encounter: Payer: Self-pay | Admitting: Internal Medicine

## 2017-11-08 ENCOUNTER — Ambulatory Visit (INDEPENDENT_AMBULATORY_CARE_PROVIDER_SITE_OTHER): Payer: 59 | Admitting: Internal Medicine

## 2017-11-08 VITALS — BP 124/72 | HR 62 | Temp 98.4°F | Ht 70.0 in | Wt 214.0 lb

## 2017-11-08 DIAGNOSIS — R682 Dry mouth, unspecified: Secondary | ICD-10-CM

## 2017-11-08 DIAGNOSIS — K130 Diseases of lips: Secondary | ICD-10-CM

## 2017-11-08 DIAGNOSIS — Z0000 Encounter for general adult medical examination without abnormal findings: Secondary | ICD-10-CM | POA: Diagnosis not present

## 2017-11-08 DIAGNOSIS — E785 Hyperlipidemia, unspecified: Secondary | ICD-10-CM

## 2017-11-08 MED ORDER — PRAVASTATIN SODIUM 40 MG PO TABS: 40.0000 mg | ORAL_TABLET | Freq: Every day | ORAL | 3 refills | Status: DC

## 2017-11-08 NOTE — Assessment & Plan Note (Addendum)
Increase Pravachol to 40 mg/d Cardiac CT calcium scoring option discussed

## 2017-11-08 NOTE — Assessment & Plan Note (Addendum)
His dry mouth is 75% better Claritin po

## 2017-11-08 NOTE — Progress Notes (Signed)
Subjective:  Patient ID: Adrian Cruz, male    DOB: 09/07/1965  Age: 52 y.o. MRN: [id number]  CC: No chief complaint on file.   HPI Adrian Cruz presents for a well exam His dry mouth is 75% better Outpatient Medications Prior to Visit  Medication Sig Dispense Refill  . Cholecalciferol (VITAMIN D3) 1000 UNITS CAPS Take by mouth daily.      . ciclopirox (PENLAC) 8 % solution Apply topically at bedtime. Apply over nail and surrounding skin. Apply daily over previous coat. After seven (7) days, may remove with alcohol and continue cycle. 6.6 mL 0  . fluconazole (DIFLUCAN) 100 MG tablet Take 2 tabs on day#1, then 1 tab daily on Days #2-10 11 tablet 1  . loratadine (CLARITIN) 10 MG tablet Take 1 tablet (10 mg total) by mouth daily. 30 tablet 11  . nystatin (MYCOSTATIN) 100000 UNIT/ML suspension Take 5 cc's by mouth, Swish and swallow. 280 mL 1  . pantoprazole (PROTONIX) 40 MG tablet Take 1 tablet (40 mg total) by mouth 2 (two) times daily. 60 tablet 1  . pravastatin (PRAVACHOL) 20 MG tablet Take 1 tablet (20 mg total) by mouth daily. 90 tablet 2  . triamcinolone ointment (KENALOG) 0.1 % Apply 1 application topically 2 (two) times daily. 30 g 2  . valACYclovir (VALTREX) 500 MG tablet Take 1 tablet (500 mg total) by mouth 2 (two) times daily. 60 tablet 5   Facility-Administered Medications Prior to Visit  Medication Dose Route Frequency Provider Last Rate Last Dose  . 0.9 %  sodium chloride infusion  500 mL Intravenous Once Irene Shipper, MD        ROS: Review of Systems  Constitutional: Negative for appetite change, fatigue and unexpected weight change.  HENT: Negative for congestion, nosebleeds, sneezing, sore throat and trouble swallowing.   Eyes: Negative for itching and visual disturbance.  Respiratory: Negative for cough.   Cardiovascular: Negative for chest pain, palpitations and leg swelling.  Gastrointestinal: Negative for abdominal distention, blood in stool,  diarrhea and nausea.  Genitourinary: Negative for frequency and hematuria.  Musculoskeletal: Negative for back pain, gait problem, joint swelling and neck pain.  Skin: Negative for rash.  Neurological: Negative for dizziness, tremors, speech difficulty and weakness.  Psychiatric/Behavioral: Negative for agitation, dysphoric mood, sleep disturbance and suicidal ideas. The patient is not nervous/anxious.     Objective:  BP 124/72 (BP Location: Left Arm, Patient Position: Sitting, Cuff Size: Large)   Pulse 62   Temp 98.4 F (36.9 C) (Oral)   Ht 5\' 10"  (1.778 m)   Wt 214 lb (97.1 kg)   SpO2 98%   BMI 30.71 kg/m   BP Readings from Last 3 Encounters:  11/08/17 124/72  07/15/17 127/88  06/22/17 106/60    Wt Readings from Last 3 Encounters:  11/08/17 214 lb (97.1 kg)  07/15/17 216 lb (98 kg)  06/22/17 216 lb (98 kg)    Physical Exam  Constitutional: He is oriented to person, place, and time. He appears well-developed and well-nourished. No distress.  HENT:  Head: Normocephalic and atraumatic.  Right Ear: External ear normal.  Left Ear: External ear normal.  Nose: Nose normal.  Mouth/Throat: Oropharynx is clear and moist. No oropharyngeal exudate.  Eyes: Pupils are equal, round, and reactive to light. Conjunctivae and EOM are normal. Right eye exhibits no discharge. Left eye exhibits no discharge. No scleral icterus.  Neck: Normal range of motion. Neck supple. No JVD present. No tracheal deviation present. No thyromegaly present.  Cardiovascular: Normal rate, regular rhythm, normal heart sounds and intact distal pulses. Exam reveals no gallop and no friction rub.  No murmur heard. Pulmonary/Chest: Effort normal and breath sounds normal. No stridor. No respiratory distress. He has no wheezes. He has no rales. He exhibits no tenderness.  Abdominal: Soft. Bowel sounds are normal. He exhibits no distension and no mass. There is no tenderness. There is no rebound and no guarding.    Genitourinary: Rectum normal, prostate normal and penis normal. Rectal exam shows guaiac negative stool. No penile tenderness.  Musculoskeletal: Normal range of motion. He exhibits no edema or tenderness.  Lymphadenopathy:    He has no cervical adenopathy.  Neurological: He is alert and oriented to person, place, and time. He has normal reflexes. He displays normal reflexes. No cranial nerve deficit. He exhibits normal muscle tone. Coordination normal.  Skin: Skin is warm and dry. No rash noted. He is not diaphoretic. No erythema. No pallor.  Psychiatric: He has a normal mood and affect. His behavior is normal. Judgment and thought content normal.    Lab Results  Component Value Date   WBC 7.1 11/02/2017   HGB 14.9 11/02/2017   HCT 43.9 11/02/2017   PLT 277.0 11/02/2017   GLUCOSE 99 11/02/2017   CHOL 268 (H) 11/02/2017   TRIG 173.0 (H) 11/02/2017   HDL 44.50 11/02/2017   LDLDIRECT 198.4 11/28/2012   LDLCALC 189 (H) 11/02/2017   ALT 27 11/02/2017   AST 24 11/02/2017   NA 140 11/02/2017   K 4.2 11/02/2017   CL 105 11/02/2017   CREATININE 0.87 11/02/2017   BUN 13 11/02/2017   CO2 27 11/02/2017   TSH 3.65 11/02/2017   PSA 1.50 11/02/2017   INR 1.1 (H) 04/28/2017   HGBA1C 5.5 06/02/2017    Dg Chest 2 View  Result Date: 03/30/2017 CLINICAL DATA:  Follow-up pneumonia. Persistent fatigue and shortness of breath. EXAM: CHEST  2 VIEW COMPARISON:  Two-view chest x-ray 01/19/2017. FINDINGS: Heart size normal. Previously noted right middle lobe pneumonia has cleared. No residual airspace disease is present. There is no edema or effusion. The visualized soft tissues and bony thorax are unremarkable. IMPRESSION: Negative two view chest x-ray Electronically Signed   By: San Morelle M.D.   On: 03/30/2017 08:30    Assessment & Plan:   There are no diagnoses linked to this encounter.   No orders of the defined types were placed in this encounter.    Follow-up: No follow-ups  on file.  Walker Kehr, MD

## 2017-11-08 NOTE — Patient Instructions (Signed)
Increase Pravachol to 40 mg/d Cardiac CT calcium scoring

## 2017-11-08 NOTE — Assessment & Plan Note (Signed)
better 

## 2017-11-08 NOTE — Assessment & Plan Note (Signed)
We discussed age appropriate health related issues, including available/recomended screening tests and vaccinations. We discussed a need for adhering to healthy diet and exercise. Labs were ordered to be later reviewed . All questions were answered.  Colon 2019

## 2018-07-14 ENCOUNTER — Ambulatory Visit: Payer: 59 | Admitting: Internal Medicine

## 2018-07-14 ENCOUNTER — Other Ambulatory Visit: Payer: Self-pay | Admitting: Internal Medicine

## 2018-07-14 DIAGNOSIS — Z0000 Encounter for general adult medical examination without abnormal findings: Secondary | ICD-10-CM

## 2018-07-22 ENCOUNTER — Other Ambulatory Visit (INDEPENDENT_AMBULATORY_CARE_PROVIDER_SITE_OTHER): Payer: Commercial Managed Care - PPO

## 2018-07-22 DIAGNOSIS — Z0000 Encounter for general adult medical examination without abnormal findings: Secondary | ICD-10-CM

## 2018-07-22 LAB — CBC WITH DIFFERENTIAL/PLATELET
Basophils Absolute: 0 10*3/uL (ref 0.0–0.1)
Basophils Relative: 0.4 % (ref 0.0–3.0)
Eosinophils Absolute: 0.2 10*3/uL (ref 0.0–0.7)
Eosinophils Relative: 3.4 % (ref 0.0–5.0)
HCT: 41.7 % (ref 39.0–52.0)
Hemoglobin: 14.3 g/dL (ref 13.0–17.0)
Lymphocytes Relative: 42.2 % (ref 12.0–46.0)
Lymphs Abs: 3 10*3/uL (ref 0.7–4.0)
MCHC: 34.2 g/dL (ref 30.0–36.0)
MCV: 84.2 fl (ref 78.0–100.0)
Monocytes Absolute: 0.7 10*3/uL (ref 0.1–1.0)
Monocytes Relative: 10 % (ref 3.0–12.0)
Neutro Abs: 3.1 10*3/uL (ref 1.4–7.7)
Neutrophils Relative %: 44 % (ref 43.0–77.0)
Platelets: 310 10*3/uL (ref 150.0–400.0)
RBC: 4.96 Mil/uL (ref 4.22–5.81)
RDW: 13.1 % (ref 11.5–15.5)
WBC: 7.1 10*3/uL (ref 4.0–10.5)

## 2018-07-22 LAB — URINALYSIS
Bilirubin Urine: NEGATIVE
Hgb urine dipstick: NEGATIVE
KETONES UR: NEGATIVE
Leukocytes,Ua: NEGATIVE
Nitrite: NEGATIVE
Specific Gravity, Urine: >=1.03 — AB (ref 1.000–1.030)
Total Protein, Urine: NEGATIVE
URINE GLUCOSE: NEGATIVE
Urobilinogen, UA: 0.2 (ref 0.0–1.0)
pH: 5.5 (ref 5.0–8.0)

## 2018-07-22 LAB — LIPID PANEL
Cholesterol: 284 mg/dL — ABNORMAL HIGH (ref 0–200)
HDL: 35.7 mg/dL — ABNORMAL LOW (ref >39.00)
LDL Cholesterol: 216 mg/dL — ABNORMAL HIGH (ref 0–99)
NonHDL: 248.31
Total CHOL/HDL Ratio: 8
Triglycerides: 161 mg/dL — ABNORMAL HIGH (ref 0.0–149.0)
VLDL: 32.2 mg/dL (ref 0.0–40.0)

## 2018-07-22 LAB — TSH: TSH: 1.62 u[IU]/mL (ref 0.35–4.50)

## 2018-07-22 LAB — HEPATIC FUNCTION PANEL
ALT: 37 U/L (ref 0–53)
AST: 22 U/L (ref 0–37)
Albumin: 4.2 g/dL (ref 3.5–5.2)
Alkaline Phosphatase: 56 U/L (ref 39–117)
BILIRUBIN DIRECT: 0.1 mg/dL (ref 0.0–0.3)
Total Bilirubin: 0.5 mg/dL (ref 0.2–1.2)
Total Protein: 7 g/dL (ref 6.0–8.3)

## 2018-07-22 LAB — BASIC METABOLIC PANEL
BUN: 18 mg/dL (ref 6–23)
CO2: 25 mEq/L (ref 19–32)
Calcium: 8.6 mg/dL (ref 8.4–10.5)
Chloride: 108 mEq/L (ref 96–112)
Creatinine, Ser: 0.84 mg/dL (ref 0.40–1.50)
GFR: 94.9 mL/min (ref >60.00)
Glucose, Bld: 93 mg/dL (ref 70–99)
Potassium: 4 mEq/L (ref 3.5–5.1)
Sodium: 142 mEq/L (ref 135–145)

## 2018-07-22 LAB — PSA: PSA: 1.25 ng/mL (ref 0.10–4.00)

## 2018-07-26 ENCOUNTER — Encounter: Payer: Self-pay | Admitting: Internal Medicine

## 2018-07-26 ENCOUNTER — Ambulatory Visit (INDEPENDENT_AMBULATORY_CARE_PROVIDER_SITE_OTHER): Payer: Commercial Managed Care - PPO | Admitting: Internal Medicine

## 2018-07-26 ENCOUNTER — Encounter: Payer: 59 | Admitting: Internal Medicine

## 2018-07-26 VITALS — BP 124/76 | HR 68 | Temp 97.8°F | Ht 70.0 in | Wt 218.0 lb

## 2018-07-26 DIAGNOSIS — Z0000 Encounter for general adult medical examination without abnormal findings: Secondary | ICD-10-CM

## 2018-07-26 DIAGNOSIS — R682 Dry mouth, unspecified: Secondary | ICD-10-CM | POA: Diagnosis not present

## 2018-07-26 DIAGNOSIS — E785 Hyperlipidemia, unspecified: Secondary | ICD-10-CM

## 2018-07-26 NOTE — Assessment & Plan Note (Addendum)
We discussed age appropriate health related issues, including available/recomended screening tests and vaccinations. We discussed a need for adhering to healthy diet and exercise. Labs were ordered to be later reviewed . All questions were answered. Colon 2019 Declined shots A cardiac CT scan for calcium scoring offered

## 2018-07-26 NOTE — Patient Instructions (Signed)

## 2018-07-26 NOTE — Assessment & Plan Note (Signed)
Pravachol to 40 mg/d - d/c'd Cardiac CT calcium scoring option discussed

## 2018-07-26 NOTE — Assessment & Plan Note (Signed)
Gabapentin trial offered Diets, vegetarian diet offered

## 2018-07-26 NOTE — Progress Notes (Signed)
Subjective:  Patient ID: Adrian Cruz, male    DOB: [birth year]  Age: 53 y.o. MRN: [id number]  CC: No chief complaint on file.   HPI Odus Clasby presents for a well exam C/o bad taste in the mouth Stopped Pravachol due to abd discomfort  Outpatient Medications Prior to Visit  Medication Sig Dispense Refill  . Cholecalciferol (VITAMIN D3) 1000 UNITS CAPS Take by mouth daily.      . ciclopirox (PENLAC) 8 % solution Apply topically at bedtime. Apply over nail and surrounding skin. Apply daily over previous coat. After seven (7) days, may remove with alcohol and continue cycle. 6.6 mL 0  . fluconazole (DIFLUCAN) 100 MG tablet Take 2 tabs on day#1, then 1 tab daily on Days #2-10 11 tablet 1  . loratadine (CLARITIN) 10 MG tablet Take 1 tablet (10 mg total) by mouth daily. 30 tablet 11  . nystatin (MYCOSTATIN) 100000 UNIT/ML suspension Take 5 cc's by mouth, Swish and swallow. 280 mL 1  . pantoprazole (PROTONIX) 40 MG tablet Take 1 tablet (40 mg total) by mouth 2 (two) times daily. 60 tablet 1  . pravastatin (PRAVACHOL) 40 MG tablet Take 1 tablet (40 mg total) by mouth daily. 90 tablet 3  . triamcinolone ointment (KENALOG) 0.1 % Apply 1 application topically 2 (two) times daily. 30 g 2  . valACYclovir (VALTREX) 500 MG tablet Take 1 tablet (500 mg total) by mouth 2 (two) times daily. 60 tablet 5   Facility-Administered Medications Prior to Visit  Medication Dose Route Frequency Provider Last Rate Last Dose  . 0.9 %  sodium chloride infusion  500 mL Intravenous Once Irene Shipper, MD        ROS: Review of Systems  Constitutional: Negative for appetite change, fatigue and unexpected weight change.  HENT: Negative for congestion, nosebleeds, sneezing, sore throat and trouble swallowing.   Eyes: Negative for itching and visual disturbance.  Respiratory: Negative for cough.   Cardiovascular: Negative for chest pain, palpitations and leg swelling.  Gastrointestinal: Negative for  abdominal distention, blood in stool, diarrhea and nausea.  Genitourinary: Negative for frequency and hematuria.  Musculoskeletal: Negative for back pain, gait problem, joint swelling and neck pain.  Skin: Negative for rash.  Neurological: Negative for dizziness, tremors, speech difficulty and weakness.  Psychiatric/Behavioral: Negative for agitation, dysphoric mood, sleep disturbance and suicidal ideas. The patient is not nervous/anxious.     Objective:  BP 124/76 (BP Location: Left Arm, Patient Position: Sitting, Cuff Size: Large)   Pulse 68   Temp 97.8 F (36.6 C) (Oral)   Ht 5\' 10"  (1.778 m)   Wt 218 lb (98.9 kg)   SpO2 98%   BMI 31.28 kg/m   BP Readings from Last 3 Encounters:  07/26/18 124/76  11/08/17 124/72  07/15/17 127/88    Wt Readings from Last 3 Encounters:  07/26/18 218 lb (98.9 kg)  11/08/17 214 lb (97.1 kg)  07/15/17 216 lb (98 kg)    Physical Exam Constitutional:      General: He is not in acute distress.    Appearance: He is well-developed.     Comments: NAD  Eyes:     Conjunctiva/sclera: Conjunctivae normal.     Pupils: Pupils are equal, round, and reactive to light.  Neck:     Musculoskeletal: Normal range of motion.     Thyroid: No thyromegaly.     Vascular: No JVD.  Cardiovascular:     Rate and Rhythm: Normal rate and regular rhythm.  Heart sounds: Normal heart sounds. No murmur. No friction rub. No gallop.   Pulmonary:     Effort: Pulmonary effort is normal. No respiratory distress.     Breath sounds: Normal breath sounds. No wheezing or rales.  Chest:     Chest wall: No tenderness.  Abdominal:     General: Bowel sounds are normal. There is no distension.     Palpations: Abdomen is soft. There is no mass.     Tenderness: There is no abdominal tenderness. There is no guarding or rebound.  Genitourinary:    Prostate: Normal.     Rectum: Normal. Guaiac result negative.  Musculoskeletal: Normal range of motion.        General: No  tenderness.  Lymphadenopathy:     Cervical: No cervical adenopathy.  Skin:    General: Skin is warm and dry.     Findings: No rash.  Neurological:     Mental Status: He is alert and oriented to person, place, and time.     Cranial Nerves: No cranial nerve deficit.     Motor: No abnormal muscle tone.     Coordination: Coordination normal.     Gait: Gait normal.     Deep Tendon Reflexes: Reflexes are normal and symmetric.  Psychiatric:        Mood and Affect: Mood normal.        Behavior: Behavior normal.        Thought Content: Thought content normal.        Judgment: Judgment normal.     Lab Results  Component Value Date   WBC 7.1 07/22/2018   HGB 14.3 07/22/2018   HCT 41.7 07/22/2018   PLT 310.0 07/22/2018   GLUCOSE 93 07/22/2018   CHOL 284 (H) 07/22/2018   TRIG 161.0 (H) 07/22/2018   HDL 35.70 (L) 07/22/2018   LDLDIRECT 198.4 11/28/2012   LDLCALC 216 (H) 07/22/2018   ALT 37 07/22/2018   AST 22 07/22/2018   NA 142 07/22/2018   K 4.0 07/22/2018   CL 108 07/22/2018   CREATININE 0.84 07/22/2018   BUN 18 07/22/2018   CO2 25 07/22/2018   TSH 1.62 07/22/2018   PSA 1.25 07/22/2018   INR 1.1 (H) 04/28/2017   HGBA1C 5.5 06/02/2017    Dg Chest 2 View  Result Date: 03/30/2017 CLINICAL DATA:  Follow-up pneumonia. Persistent fatigue and shortness of breath. EXAM: CHEST  2 VIEW COMPARISON:  Two-view chest x-ray 01/19/2017. FINDINGS: Heart size normal. Previously noted right middle lobe pneumonia has cleared. No residual airspace disease is present. There is no edema or effusion. The visualized soft tissues and bony thorax are unremarkable. IMPRESSION: Negative two view chest x-ray Electronically Signed   By: San Morelle M.D.   On: 03/30/2017 08:30    Assessment & Plan:   There are no diagnoses linked to this encounter.   No orders of the defined types were placed in this encounter.    Follow-up: No follow-ups on file.  Walker Kehr, MD

## 2018-08-05 ENCOUNTER — Encounter: Payer: 59 | Admitting: Internal Medicine

## 2018-08-25 ENCOUNTER — Other Ambulatory Visit: Payer: Commercial Managed Care - PPO

## 2018-11-11 ENCOUNTER — Encounter: Payer: 59 | Admitting: Internal Medicine

## 2018-12-27 ENCOUNTER — Other Ambulatory Visit: Payer: Self-pay | Admitting: Internal Medicine

## 2018-12-27 NOTE — Telephone Encounter (Signed)
Ok to Rf? He has allergies and intolerance to statins.

## 2019-02-02 ENCOUNTER — Inpatient Hospital Stay: Admission: RE | Admit: 2019-02-02 | Payer: Commercial Managed Care - PPO | Source: Ambulatory Visit

## 2019-02-28 ENCOUNTER — Other Ambulatory Visit: Payer: Self-pay

## 2019-02-28 ENCOUNTER — Ambulatory Visit (INDEPENDENT_AMBULATORY_CARE_PROVIDER_SITE_OTHER)
Admission: RE | Admit: 2019-02-28 | Discharge: 2019-02-28 | Disposition: A | Discharge status: Home / Self Care | Payer: Self-pay | Source: Ambulatory Visit | Attending: Internal Medicine | Admitting: Internal Medicine

## 2019-02-28 DIAGNOSIS — E785 Hyperlipidemia, unspecified: Secondary | ICD-10-CM

## 2019-03-01 ENCOUNTER — Other Ambulatory Visit: Payer: Self-pay | Admitting: Internal Medicine

## 2019-03-01 MED ORDER — ASPIRIN EC 81 MG PO TBEC: 81.0000 mg | DELAYED_RELEASE_TABLET | Freq: Every day | ORAL | 3 refills | Status: DC

## 2019-08-18 DIAGNOSIS — H2513 Age-related nuclear cataract, bilateral: Secondary | ICD-10-CM

## 2019-08-18 DIAGNOSIS — H47333 Pseudopapilledema of optic disc, bilateral: Secondary | ICD-10-CM

## 2019-08-18 DIAGNOSIS — H524 Presbyopia: Secondary | ICD-10-CM

## 2019-08-18 DIAGNOSIS — H5203 Hypermetropia, bilateral: Secondary | ICD-10-CM

## 2019-08-18 DIAGNOSIS — H25013 Cortical age-related cataract, bilateral: Secondary | ICD-10-CM

## 2019-08-18 HISTORY — DX: Presbyopia: H52.4

## 2019-08-18 HISTORY — DX: Pseudopapilledema of optic disc, bilateral: H47.333

## 2019-08-18 HISTORY — DX: Cortical age-related cataract, bilateral: H25.013

## 2019-08-18 HISTORY — DX: Hypermetropia, bilateral: H52.03

## 2019-08-18 HISTORY — DX: Age-related nuclear cataract, bilateral: H25.13

## 2019-09-15 ENCOUNTER — Other Ambulatory Visit: Payer: Self-pay | Admitting: Internal Medicine

## 2019-09-15 DIAGNOSIS — Z0000 Encounter for general adult medical examination without abnormal findings: Secondary | ICD-10-CM

## 2019-09-21 ENCOUNTER — Other Ambulatory Visit (INDEPENDENT_AMBULATORY_CARE_PROVIDER_SITE_OTHER): Payer: BC Managed Care – PPO

## 2019-09-21 DIAGNOSIS — Z0000 Encounter for general adult medical examination without abnormal findings: Secondary | ICD-10-CM

## 2019-09-21 LAB — BASIC METABOLIC PANEL
BUN: 16 mg/dL (ref 6–23)
CO2: 28 mEq/L (ref 19–32)
Calcium: 8.8 mg/dL (ref 8.4–10.5)
Chloride: 105 mEq/L (ref 96–112)
Creatinine, Ser: 0.96 mg/dL (ref 0.40–1.50)
GFR: 81 mL/min (ref >60.00)
Glucose, Bld: 94 mg/dL (ref 70–99)
Potassium: 4.1 mEq/L (ref 3.5–5.1)
Sodium: 141 mEq/L (ref 135–145)

## 2019-09-21 LAB — CBC WITH DIFFERENTIAL/PLATELET
Basophils Absolute: 0 10*3/uL (ref 0.0–0.1)
Basophils Relative: 0.6 % (ref 0.0–3.0)
Eosinophils Absolute: 0.3 10*3/uL (ref 0.0–0.7)
Eosinophils Relative: 4.7 % (ref 0.0–5.0)
HCT: 42.2 % (ref 39.0–52.0)
Hemoglobin: 14 g/dL (ref 13.0–17.0)
Lymphocytes Relative: 40.8 % (ref 12.0–46.0)
Lymphs Abs: 2.9 10*3/uL (ref 0.7–4.0)
MCHC: 33.2 g/dL (ref 30.0–36.0)
MCV: 85.8 fl (ref 78.0–100.0)
Monocytes Absolute: 0.7 10*3/uL (ref 0.1–1.0)
Monocytes Relative: 10.4 % (ref 3.0–12.0)
Neutro Abs: 3.1 10*3/uL (ref 1.4–7.7)
Neutrophils Relative %: 43.5 % (ref 43.0–77.0)
Platelets: 254 10*3/uL (ref 150.0–400.0)
RBC: 4.92 Mil/uL (ref 4.22–5.81)
RDW: 13.2 % (ref 11.5–15.5)
WBC: 7.1 10*3/uL (ref 4.0–10.5)

## 2019-09-21 LAB — URINALYSIS
Bilirubin Urine: NEGATIVE
Hgb urine dipstick: NEGATIVE
Ketones, ur: NEGATIVE
Leukocytes,Ua: NEGATIVE
Nitrite: NEGATIVE
Specific Gravity, Urine: 1.025 (ref 1.000–1.030)
Total Protein, Urine: NEGATIVE
Urine Glucose: NEGATIVE
Urobilinogen, UA: 0.2 (ref 0.0–1.0)
pH: 6 (ref 5.0–8.0)

## 2019-09-21 LAB — LIPID PANEL
Cholesterol: 216 mg/dL — ABNORMAL HIGH (ref 0–200)
HDL: 43.6 mg/dL (ref >39.00)
LDL Cholesterol: 159 mg/dL — ABNORMAL HIGH (ref 0–99)
NonHDL: 172.21
Total CHOL/HDL Ratio: 5
Triglycerides: 66 mg/dL (ref 0.0–149.0)
VLDL: 13.2 mg/dL (ref 0.0–40.0)

## 2019-09-21 LAB — HEPATIC FUNCTION PANEL
ALT: 20 U/L (ref 0–53)
AST: 19 U/L (ref 0–37)
Albumin: 4.2 g/dL (ref 3.5–5.2)
Alkaline Phosphatase: 57 U/L (ref 39–117)
Bilirubin, Direct: 0.1 mg/dL (ref 0.0–0.3)
Total Bilirubin: 0.7 mg/dL (ref 0.2–1.2)
Total Protein: 6.9 g/dL (ref 6.0–8.3)

## 2019-09-21 LAB — PSA: PSA: 1.5 ng/mL (ref 0.10–4.00)

## 2019-09-21 LAB — TSH: TSH: 2.12 u[IU]/mL (ref 0.35–4.50)

## 2019-09-26 ENCOUNTER — Encounter: Payer: Self-pay | Admitting: Internal Medicine

## 2019-09-26 ENCOUNTER — Ambulatory Visit: Payer: BC Managed Care – PPO | Admitting: Internal Medicine

## 2019-09-26 ENCOUNTER — Other Ambulatory Visit: Payer: Self-pay

## 2019-09-26 VITALS — BP 112/66 | HR 60 | Temp 98.3°F | Ht 70.0 in | Wt 213.0 lb

## 2019-09-26 DIAGNOSIS — R0683 Snoring: Secondary | ICD-10-CM

## 2019-09-26 DIAGNOSIS — Z23 Encounter for immunization: Secondary | ICD-10-CM

## 2019-09-26 DIAGNOSIS — Z0000 Encounter for general adult medical examination without abnormal findings: Secondary | ICD-10-CM | POA: Diagnosis not present

## 2019-09-26 DIAGNOSIS — I2510 Atherosclerotic heart disease of native coronary artery without angina pectoris: Secondary | ICD-10-CM

## 2019-09-26 DIAGNOSIS — E785 Hyperlipidemia, unspecified: Secondary | ICD-10-CM

## 2019-09-26 DIAGNOSIS — I2583 Coronary atherosclerosis due to lipid rich plaque: Secondary | ICD-10-CM

## 2019-09-26 DIAGNOSIS — R682 Dry mouth, unspecified: Secondary | ICD-10-CM

## 2019-09-26 HISTORY — DX: Atherosclerotic heart disease of native coronary artery without angina pectoris: I25.10

## 2019-09-26 HISTORY — DX: Snoring: R06.83

## 2019-09-26 MED ORDER — ATORVASTATIN CALCIUM 20 MG PO TABS
20.0000 mg | ORAL_TABLET | Freq: Every day | ORAL | 3 refills | Status: DC
Start: 2019-09-26 — End: 2020-07-30

## 2019-09-26 MED ORDER — CICLOPIROX 8 % EX SOLN: Freq: Every day | CUTANEOUS | 0 refills | Status: DC

## 2019-09-26 NOTE — Assessment & Plan Note (Signed)
A cardiac CT scan for calcium score is 54 in 2020

## 2019-09-26 NOTE — Addendum Note (Signed)
Addended by: Karren Cobble on: 09/26/2019 09:58 AM   Modules accepted: Orders

## 2019-09-26 NOTE — Assessment & Plan Note (Signed)
  We discussed age appropriate health related issues, including available/recomended screening tests and vaccinations. Labs were ordered to be later reviewed . All questions were answered. We discussed one or more of the following - seat belt use, use of sunscreen/sun exposure exercise, safe sex, fall risk reduction, second hand smoke exposure, firearm use and storage, seat belt use, a need for adhering to healthy diet and exercise. Labs were ordered or discussed if they are available. All questions were answered.   

## 2019-09-26 NOTE — Assessment & Plan Note (Signed)
Pulm ref. His dentist suggested a sleep study, snoring and teeth grinding

## 2019-09-26 NOTE — Assessment & Plan Note (Signed)
Chronic  - better D/c Mouth washes

## 2019-09-26 NOTE — Assessment & Plan Note (Addendum)
On Lipitor - increase to 20 mg/d A cardiac CT scan for calcium score is 54

## 2019-09-26 NOTE — Progress Notes (Signed)
Subjective:  Patient ID: Adrian Cruz, male    DOB: 28-Mar-1966  Age: 54 y.o. MRN: [id number]  CC: No chief complaint on file.   HPI Adrian Cruz presents for a well exam Dentist suggested a sleep study, snoring C/o toenail fungus - feet B Pt is taking Lipitor 10 mg/d  Outpatient Medications Prior to Visit  Medication Sig Dispense Refill  . aspirin EC 81 MG tablet Take 1 tablet (81 mg total) by mouth daily. 100 tablet 3  . Cholecalciferol (VITAMIN D3) 1000 UNITS CAPS Take by mouth daily.      . ciclopirox (PENLAC) 8 % solution Apply topically at bedtime. Apply over nail and surrounding skin. Apply daily over previous coat. After seven (7) days, may remove with alcohol and continue cycle. 6.6 mL 0  . fluconazole (DIFLUCAN) 100 MG tablet Take 2 tabs on day#1, then 1 tab daily on Days #2-10 11 tablet 1  . loratadine (CLARITIN) 10 MG tablet Take 1 tablet (10 mg total) by mouth daily. 30 tablet 11  . nystatin (MYCOSTATIN) 100000 UNIT/ML suspension Take 5 cc's by mouth, Swish and swallow. 280 mL 1  . pantoprazole (PROTONIX) 40 MG tablet Take 1 tablet (40 mg total) by mouth 2 (two) times daily. 60 tablet 1  . pravastatin (PRAVACHOL) 40 MG tablet TAKE 1 TABLET BY MOUTH EVERY DAY 90 tablet 3  . triamcinolone ointment (KENALOG) 0.1 % Apply 1 application topically 2 (two) times daily. 30 g 2  . valACYclovir (VALTREX) 500 MG tablet Take 1 tablet (500 mg total) by mouth 2 (two) times daily. 60 tablet 5   Facility-Administered Medications Prior to Visit  Medication Dose Route Frequency Provider Last Rate Last Admin  . 0.9 %  sodium chloride infusion  500 mL Intravenous Once Irene Shipper, MD        ROS: Review of Systems  Constitutional: Negative for appetite change, fatigue and unexpected weight change.  HENT: Negative for congestion, nosebleeds, sneezing, sore throat and trouble swallowing.   Eyes: Negative for itching and visual disturbance.  Respiratory: Negative for cough.     Cardiovascular: Negative for chest pain, palpitations and leg swelling.  Gastrointestinal: Negative for abdominal distention, blood in stool, diarrhea and nausea.  Genitourinary: Negative for frequency and hematuria.  Musculoskeletal: Negative for back pain, gait problem, joint swelling and neck pain.  Skin: Negative for rash.  Neurological: Negative for dizziness, tremors, speech difficulty and weakness.  Psychiatric/Behavioral: Negative for agitation, dysphoric mood and sleep disturbance. The patient is not nervous/anxious.     Objective:  BP 112/66 (BP Location: Left Arm, Patient Position: Sitting, Cuff Size: Large)   Pulse 60   Temp 98.3 F (36.8 C) (Oral)   Ht 5\' 10"  (1.778 m)   Wt 213 lb (96.6 kg)   SpO2 98%   BMI 30.56 kg/m   BP Readings from Last 3 Encounters:  09/26/19 112/66  07/26/18 124/76  11/08/17 124/72    Wt Readings from Last 3 Encounters:  09/26/19 213 lb (96.6 kg)  07/26/18 218 lb (98.9 kg)  11/08/17 214 lb (97.1 kg)    Physical Exam Constitutional:      General: He is not in acute distress.    Appearance: He is well-developed.     Comments: NAD  Eyes:     Conjunctiva/sclera: Conjunctivae normal.     Pupils: Pupils are equal, round, and reactive to light.  Neck:     Thyroid: No thyromegaly.     Vascular: No JVD.  Cardiovascular:  Rate and Rhythm: Normal rate and regular rhythm.     Heart sounds: Normal heart sounds. No murmur. No friction rub. No gallop.   Pulmonary:     Effort: Pulmonary effort is normal. No respiratory distress.     Breath sounds: Normal breath sounds. No wheezing or rales.  Chest:     Chest wall: No tenderness.  Abdominal:     General: Bowel sounds are normal. There is no distension.     Palpations: Abdomen is soft. There is no mass.     Tenderness: There is no abdominal tenderness. There is no guarding or rebound.  Musculoskeletal:        General: No tenderness. Normal range of motion.     Cervical back: Normal  range of motion.  Lymphadenopathy:     Cervical: No cervical adenopathy.  Skin:    General: Skin is warm and dry.     Findings: No rash.  Neurological:     Mental Status: He is alert and oriented to person, place, and time.     Cranial Nerves: No cranial nerve deficit.     Motor: No abnormal muscle tone.     Coordination: Coordination normal.     Gait: Gait normal.     Deep Tendon Reflexes: Reflexes are normal and symmetric.  Psychiatric:        Behavior: Behavior normal.        Thought Content: Thought content normal.        Judgment: Judgment normal.     Lab Results  Component Value Date   WBC 7.1 09/21/2019   HGB 14.0 09/21/2019   HCT 42.2 09/21/2019   PLT 254.0 09/21/2019   GLUCOSE 94 09/21/2019   CHOL 216 (H) 09/21/2019   TRIG 66.0 09/21/2019   HDL 43.60 09/21/2019   LDLDIRECT 198.4 11/28/2012   LDLCALC 159 (H) 09/21/2019   ALT 20 09/21/2019   AST 19 09/21/2019   NA 141 09/21/2019   K 4.1 09/21/2019   CL 105 09/21/2019   CREATININE 0.96 09/21/2019   BUN 16 09/21/2019   CO2 28 09/21/2019   TSH 2.12 09/21/2019   PSA 1.50 09/21/2019   INR 1.1 (H) 04/28/2017   HGBA1C 5.5 06/02/2017    CT CARDIAC SCORING  Addendum Date: 02/28/2019   ADDENDUM REPORT: 02/28/2019 10:02 CLINICAL DATA:  Risk stratification EXAM: Coronary Calcium Score TECHNIQUE: The patient was scanned on a [employer] scanner. Axial non-contrast 3 mm slices were carried out through the heart. The data set was analyzed on a dedicated work station and scored using the [place]. FINDINGS: Non-cardiac: See separate report from [facility]. Ascending Aorta: Normal caliber.  Trace aortic root calcifications. Pericardium: Normal. Coronary arteries: Normal origins. IMPRESSION: Coronary calcium score of 54. This was 72nd percentile for age and sex matched control. Eleonore Chiquito, MD Electronically Signed   By: Eleonore Chiquito   On: 02/28/2019 10:02   Result Date: 02/28/2019 EXAM: OVER-READ  INTERPRETATION  CT CHEST The following report is an over-read performed by radiologist Dr. Vinnie Langton of [facility], [place] on 02/28/2019. This over-read does not include interpretation of cardiac or coronary anatomy or pathology. The coronary calcium score/coronary CTA interpretation by the cardiologist is attached. COMPARISON:  None. FINDINGS: 3 mm pulmonary nodule in the periphery of the left lower lobe (axial image 35 of series 3), stable dating back to prior CT the abdomen and pelvis 02/21/2005, considered definitively benign. Within the visualized portions of the thorax there are no other larger more  suspicious appearing pulmonary nodules or masses, there is no acute consolidative airspace disease, no pleural effusions, no pneumothorax and no lymphadenopathy. Visualized portions of the upper abdomen are unremarkable. There are no aggressive appearing lytic or blastic lesions noted in the visualized portions of the skeleton. IMPRESSION: No significant incidental noncardiac findings are noted. Electronically Signed: By: Vinnie Langton M.D. On: 02/28/2019 09:53    Assessment & Plan:    Walker Kehr, MD

## 2019-09-26 NOTE — Addendum Note (Signed)
Addended by: Cassandria Anger on: 09/26/2019 01:17 PM   Modules accepted: Orders

## 2019-10-04 ENCOUNTER — Telehealth: Payer: Self-pay

## 2019-10-04 NOTE — Telephone Encounter (Signed)
Key: [id number]

## 2019-10-11 NOTE — Telephone Encounter (Signed)
PA approved through 10/03/20

## 2019-11-04 IMAGING — CT CT HEART SCORING
2 series · 16 of 20 positions shown, 18 images · non-contrast
Comparison: None.
COMPARISON: None.

Addendum:
EXAM:
OVER-READ INTERPRETATION  CT CHEST

The following report is an over-read performed by radiologist Dr.
Nain Largaespada [REDACTED] on 02/28/2019. This
over-read does not include interpretation of cardiac or coronary
anatomy or pathology. The coronary calcium score/coronary CTA
interpretation by the cardiologist is attached.
CLINICAL DATA: Risk stratification
Coronary Calcium Score
TECHNIQUE: The patient was scanned on a [employer] scanner. Axial
non-contrast 3 mm slices were carried out through the heart. The
data set was analyzed on a dedicated work station and scored using
the [place] method.

[Series 2: casc 3.0 i36f 2 bestdiast 71 % · axial · 0.38mm/px · z∈[-277,-172]mm · 8 of 45 slices shown, 10 images]
[im 5/45  vessel]
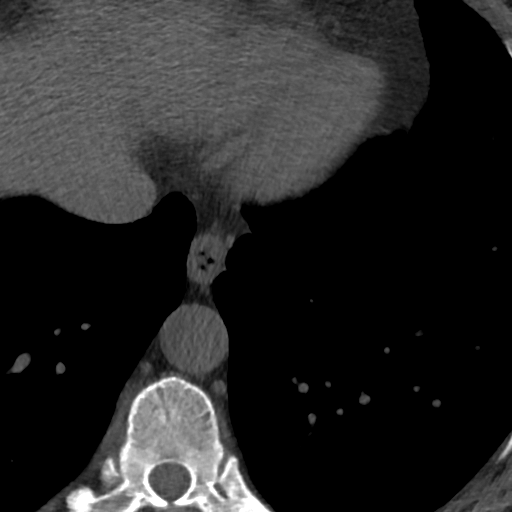
[im 5/45  lung]
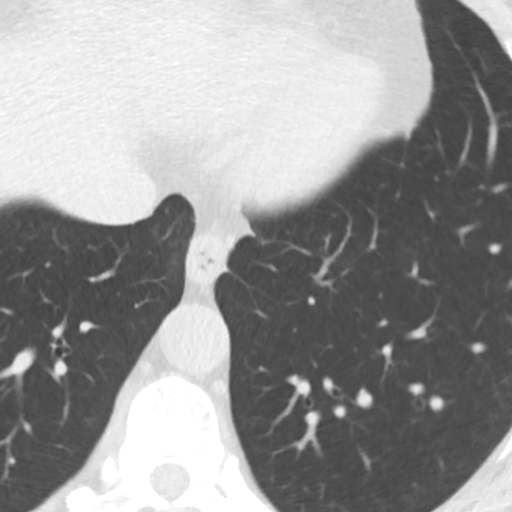
[im 10/45  vessel]
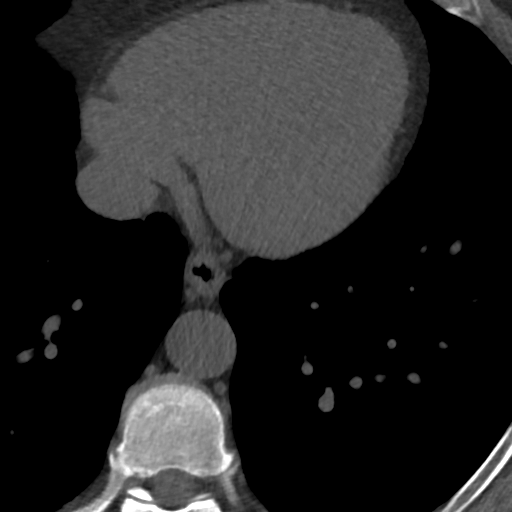
[im 15/45  vessel]
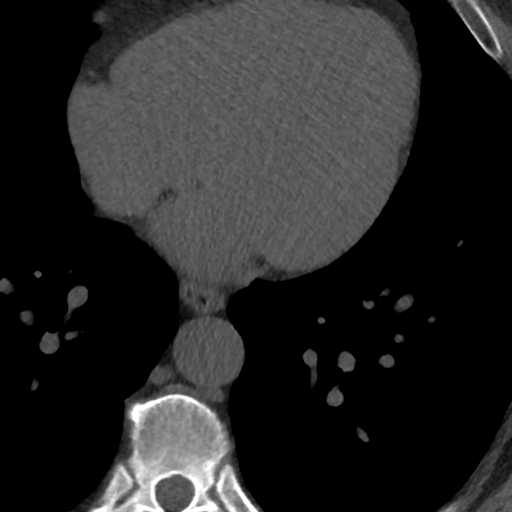
[im 20/45  vessel]
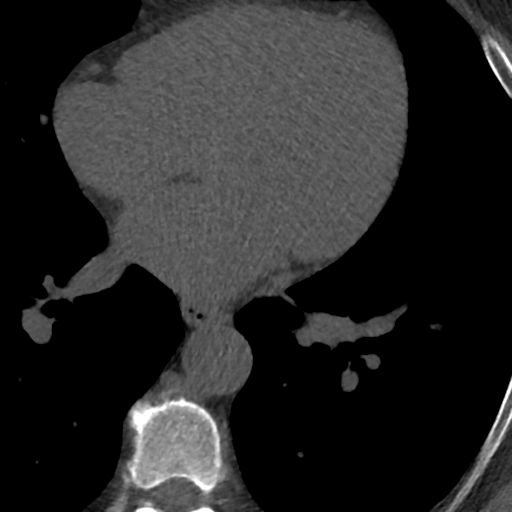
[im 25/45  vessel]
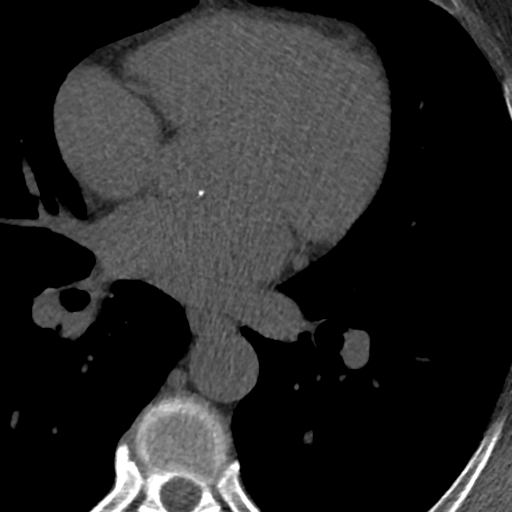
[im 25/45  lung]
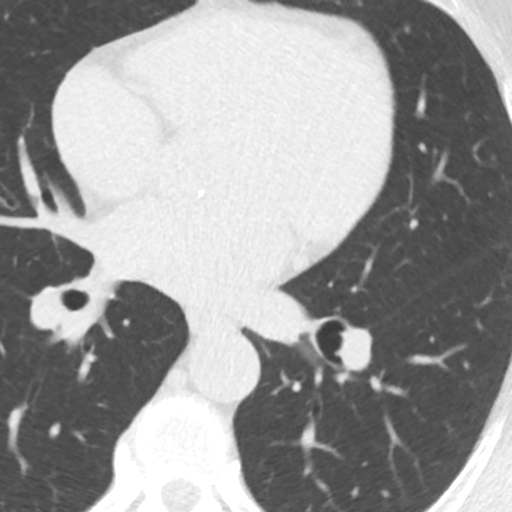
[im 30/45  vessel]
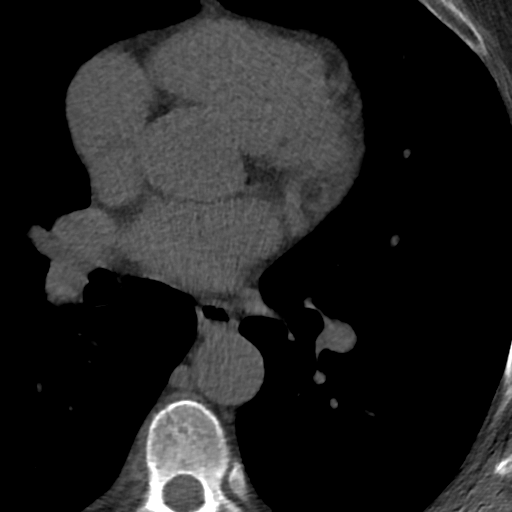
[im 35/45  vessel]
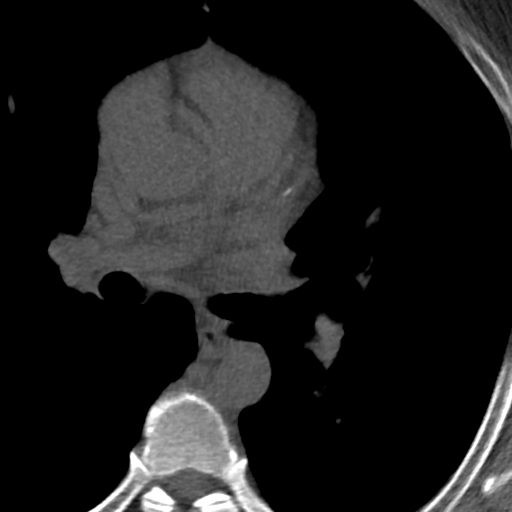
[im 40/45  vessel]
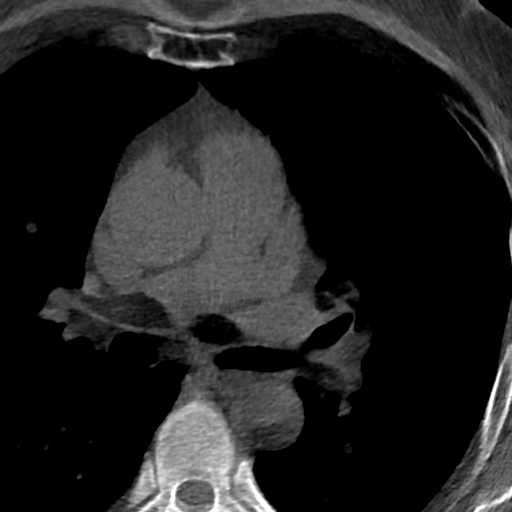

[Series 4: lung st 71 % · axial · 0.74mm/px · z∈[-277,-172]mm · 8 of 45 slices shown]
[im 5/45  lung]
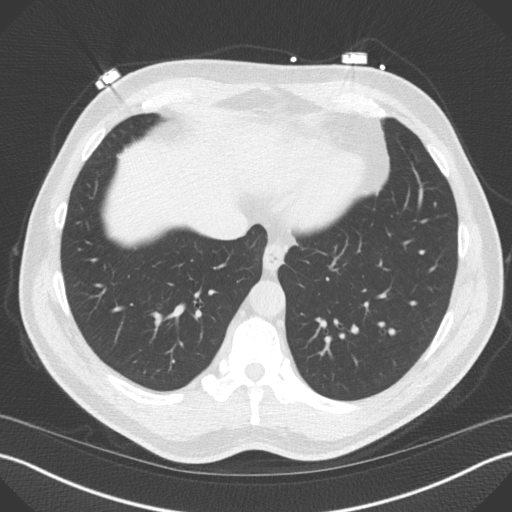
[im 10/45  lung]
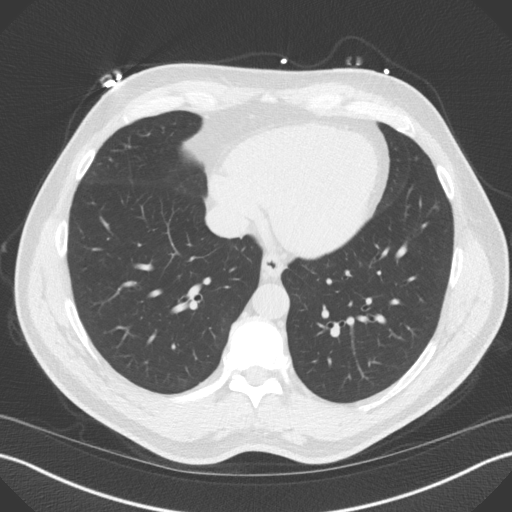
[im 15/45  lung]
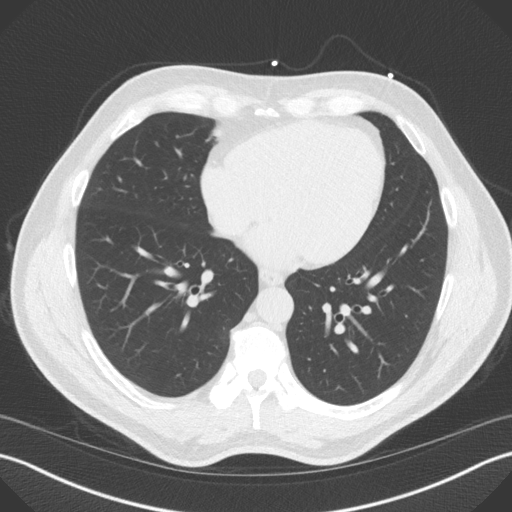
[im 20/45  lung]
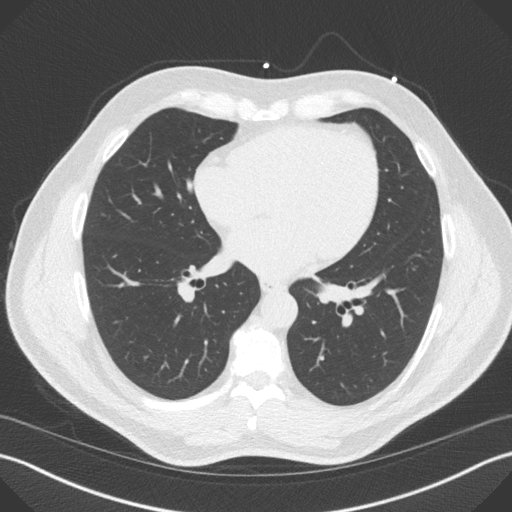
[im 25/45  lung]
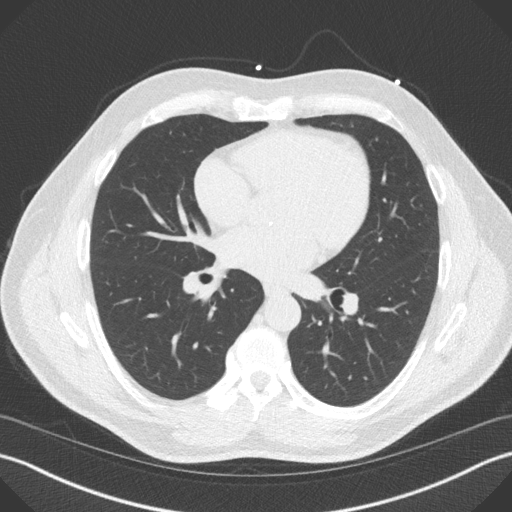
[im 30/45  lung]
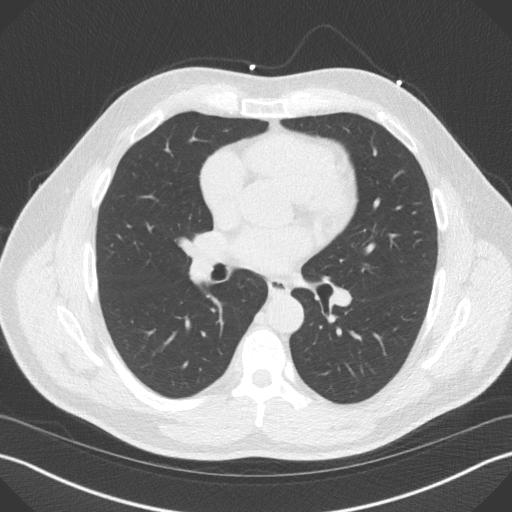
[im 35/45  lung]
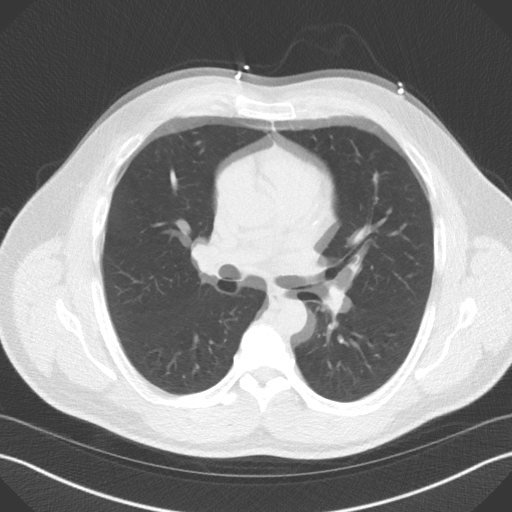
[im 40/45  lung]
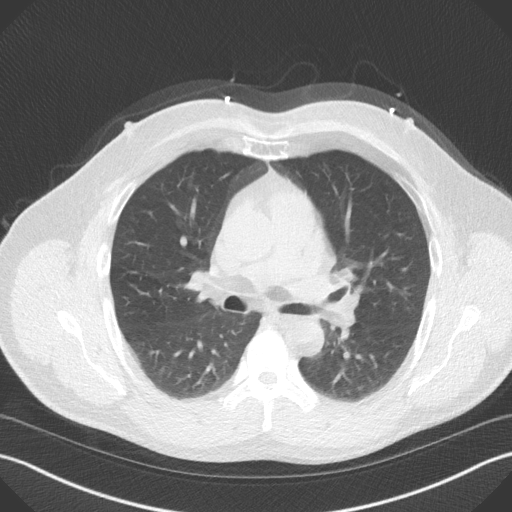

[16 of 20 positions shown; findings below may reference images not displayed]

FINDINGS: 3 mm pulmonary nodule in the periphery of the left lower lobe (axial
image 35 of series 3), stable dating back to prior CT the abdomen
and pelvis 02/21/2005, considered definitively benign. Within the
visualized portions of the thorax there are no other larger more
suspicious appearing pulmonary nodules or masses, there is no acute
consolidative airspace disease, no pleural effusions, no
pneumothorax and no lymphadenopathy. Visualized portions of the
upper abdomen are unremarkable. There are no aggressive appearing
lytic or blastic lesions noted in the visualized portions of the
skeleton.
IMPRESSION: No significant incidental noncardiac findings are noted.
FINDINGS: Non-cardiac: See separate report from [REDACTED].

Ascending Aorta: Normal caliber.  Trace aortic root calcifications.

Pericardium: Normal.

Coronary arteries: Normal origins.
IMPRESSION: Coronary calcium score of 54. This was 72nd percentile for age and
sex matched control.

*** End of Addendum ***
EXAM:
OVER-READ INTERPRETATION  CT CHEST

The following report is an over-read performed by radiologist Dr.
Nain Largaespada [REDACTED] on 02/28/2019. This
over-read does not include interpretation of cardiac or coronary
anatomy or pathology. The coronary calcium score/coronary CTA
interpretation by the cardiologist is attached.
FINDINGS: 3 mm pulmonary nodule in the periphery of the left lower lobe (axial
image 35 of series 3), stable dating back to prior CT the abdomen
and pelvis 02/21/2005, considered definitively benign. Within the
visualized portions of the thorax there are no other larger more
suspicious appearing pulmonary nodules or masses, there is no acute
consolidative airspace disease, no pleural effusions, no
pneumothorax and no lymphadenopathy. Visualized portions of the
upper abdomen are unremarkable. There are no aggressive appearing
lytic or blastic lesions noted in the visualized portions of the
skeleton.
IMPRESSION: No significant incidental noncardiac findings are noted.

## 2019-11-06 ENCOUNTER — Institutional Professional Consult (permissible substitution): Payer: BC Managed Care – PPO | Admitting: Pulmonary Disease

## 2019-12-13 ENCOUNTER — Telehealth: Payer: Self-pay | Admitting: Internal Medicine

## 2019-12-13 NOTE — Telephone Encounter (Signed)
    Patient wants to know if Dr Alain Marion will remove skin tags on various places on his body

## 2019-12-14 NOTE — Telephone Encounter (Signed)
Called pt and scheduled and appt for skin tag removals 12/21/19 @ 3:40

## 2019-12-19 ENCOUNTER — Institutional Professional Consult (permissible substitution): Payer: Self-pay | Admitting: Pulmonary Disease

## 2019-12-21 ENCOUNTER — Ambulatory Visit: Payer: BC Managed Care – PPO | Admitting: Internal Medicine

## 2019-12-21 ENCOUNTER — Other Ambulatory Visit: Payer: Self-pay

## 2019-12-21 ENCOUNTER — Encounter: Payer: Self-pay | Admitting: Internal Medicine

## 2019-12-21 ENCOUNTER — Other Ambulatory Visit: Payer: Self-pay | Admitting: Internal Medicine

## 2019-12-21 VITALS — BP 120/78 | HR 70 | Ht 70.0 in | Wt 211.0 lb

## 2019-12-21 DIAGNOSIS — D489 Neoplasm of uncertain behavior, unspecified: Secondary | ICD-10-CM

## 2019-12-21 DIAGNOSIS — L919 Hypertrophic disorder of the skin, unspecified: Secondary | ICD-10-CM

## 2019-12-21 DIAGNOSIS — D1809 Hemangioma of other sites: Secondary | ICD-10-CM | POA: Diagnosis not present

## 2019-12-21 MED ORDER — CICLOPIROX 8 % EX SOLN: Freq: Every day | CUTANEOUS | 0 refills | Status: DC

## 2019-12-21 NOTE — Patient Instructions (Signed)
Postprocedure instructions :    A Band-Aid should be  changed twice daily. You can take a shower tomorrow.  Keep the wounds clean. You can wash them with liquid soap and water. Pat dry with gauze or a Kleenex tissue  Before applying antibiotic ointment and a Band-Aid.   You need to report immediately  if fever, chills or any signs of infection develop.    The biopsy results should be available in 1 -2 weeks. 

## 2019-12-21 NOTE — Progress Notes (Signed)
Procedure Note :     Procedure :  Skin biopsy   Indication:  Changing mole (s ),  Suspicious lesion(s)   Risks including unsuccessful procedure , bleeding, infection, bruising, scar, a need for another complete procedure and others were explained to the patient in detail as well as the benefits. Informed consent was obtained.  The patient was placed in a decubitus position.  Lesion #1 on  L mid-chest  measuring 6x4  mm   Skin over lesion #1  was prepped with Betadine and alcohol  and anesthetized with 1 cc of 2% lidocaine and epinephrine, using a 25-gauge 1 inch needle.  Shave biopsy with a sterile Dermablade was carried out in the usual fashion. Hyfrecator was used to destroy the rest of the lesion potentially left behind and for hemostasis. Band-Aid was applied with antibiotic ointment.    Lesion #2 on L inner arm    measuring 6x4 mm   Skin over lesion #2  was prepped with Betadine and alcohol  and anesthetized with 1 cc of 2% lidocaine and epinephrine, using a 25-gauge 1 inch needle.  Shave biopsy with a sterile Dermablade was carried out in the usual fashion. Hyfrecator was used to destroy the rest of the lesion potentially left behind and for hemostasis. Band-Aid was applied with antibiotic ointment.  Lesion #3 on R arm   measuring 3x2  mm   Skin over lesion #3  was prepped with Betadine and alcohol  and anesthetized with 1 cc of 2% lidocaine and epinephrine, using a 25-gauge 1 inch needle.  Shave biopsy with a sterile Dermablade was carried out in the usual fashion. Hyfrecator was used to destroy the rest of the lesion potentially left behind and for hemostasis. Band-Aid was applied with antibiotic ointment. Discarded   Tolerated well. Complications none.

## 2019-12-27 ENCOUNTER — Ambulatory Visit (INDEPENDENT_AMBULATORY_CARE_PROVIDER_SITE_OTHER): Payer: BC Managed Care – PPO | Admitting: *Deleted

## 2019-12-27 ENCOUNTER — Other Ambulatory Visit: Payer: Self-pay

## 2019-12-27 DIAGNOSIS — Z23 Encounter for immunization: Secondary | ICD-10-CM | POA: Diagnosis not present

## 2020-01-01 ENCOUNTER — Encounter: Payer: Self-pay | Admitting: Internal Medicine

## 2020-07-22 ENCOUNTER — Other Ambulatory Visit: Payer: Self-pay | Admitting: Internal Medicine

## 2020-07-22 DIAGNOSIS — Z0000 Encounter for general adult medical examination without abnormal findings: Secondary | ICD-10-CM

## 2020-07-22 DIAGNOSIS — E785 Hyperlipidemia, unspecified: Secondary | ICD-10-CM

## 2020-07-25 ENCOUNTER — Other Ambulatory Visit (INDEPENDENT_AMBULATORY_CARE_PROVIDER_SITE_OTHER): Payer: No Typology Code available for payment source

## 2020-07-25 DIAGNOSIS — E785 Hyperlipidemia, unspecified: Secondary | ICD-10-CM

## 2020-07-25 DIAGNOSIS — Z0000 Encounter for general adult medical examination without abnormal findings: Secondary | ICD-10-CM | POA: Diagnosis not present

## 2020-07-25 DIAGNOSIS — Z125 Encounter for screening for malignant neoplasm of prostate: Secondary | ICD-10-CM | POA: Diagnosis not present

## 2020-07-25 LAB — CBC WITH DIFFERENTIAL/PLATELET
Basophils Absolute: 0.1 10*3/uL (ref 0.0–0.1)
Basophils Relative: 1.1 % (ref 0.0–3.0)
Eosinophils Absolute: 0.3 10*3/uL (ref 0.0–0.7)
Eosinophils Relative: 3.8 % (ref 0.0–5.0)
HCT: 42.1 % (ref 39.0–52.0)
Hemoglobin: 14.4 g/dL (ref 13.0–17.0)
Lymphocytes Relative: 39.8 % (ref 12.0–46.0)
Lymphs Abs: 2.9 10*3/uL (ref 0.7–4.0)
MCHC: 34.2 g/dL (ref 30.0–36.0)
MCV: 83.9 fl (ref 78.0–100.0)
Monocytes Absolute: 0.7 10*3/uL (ref 0.1–1.0)
Monocytes Relative: 9 % (ref 3.0–12.0)
Neutro Abs: 3.4 10*3/uL (ref 1.4–7.7)
Neutrophils Relative %: 46.3 % (ref 43.0–77.0)
Platelets: 267 10*3/uL (ref 150.0–400.0)
RBC: 5.02 Mil/uL (ref 4.22–5.81)
RDW: 13.4 % (ref 11.5–15.5)
WBC: 7.3 10*3/uL (ref 4.0–10.5)

## 2020-07-25 LAB — URINALYSIS
Bilirubin Urine: NEGATIVE
Hgb urine dipstick: NEGATIVE
Ketones, ur: NEGATIVE
Leukocytes,Ua: NEGATIVE
Nitrite: NEGATIVE
Specific Gravity, Urine: 1.025 (ref 1.000–1.030)
Total Protein, Urine: NEGATIVE
Urine Glucose: NEGATIVE
Urobilinogen, UA: 0.2 (ref 0.0–1.0)
pH: 6.5 (ref 5.0–8.0)

## 2020-07-25 LAB — TSH: TSH: 2.85 u[IU]/mL (ref 0.35–4.50)

## 2020-07-25 LAB — PSA: PSA: 1.49 ng/mL (ref 0.10–4.00)

## 2020-07-26 LAB — COMPREHENSIVE METABOLIC PANEL
ALT: 20 U/L (ref 0–53)
AST: 19 U/L (ref 0–37)
Albumin: 4.1 g/dL (ref 3.5–5.2)
Alkaline Phosphatase: 57 U/L (ref 39–117)
BUN: 16 mg/dL (ref 6–23)
CO2: 25 mEq/L (ref 19–32)
Calcium: 9.2 mg/dL (ref 8.4–10.5)
Chloride: 106 mEq/L (ref 96–112)
Creatinine, Ser: 0.97 mg/dL (ref 0.40–1.50)
GFR: 87.01 mL/min (ref >60.00)
Glucose, Bld: 103 mg/dL — ABNORMAL HIGH (ref 70–99)
Potassium: 4.1 mEq/L (ref 3.5–5.1)
Sodium: 140 mEq/L (ref 135–145)
Total Bilirubin: 0.6 mg/dL (ref 0.2–1.2)
Total Protein: 7 g/dL (ref 6.0–8.3)

## 2020-07-26 LAB — LIPID PANEL
Cholesterol: 224 mg/dL — ABNORMAL HIGH (ref 0–200)
HDL: 49.5 mg/dL (ref >39.00)
LDL Cholesterol: 140 mg/dL — ABNORMAL HIGH (ref 0–99)
NonHDL: 174.91
Total CHOL/HDL Ratio: 5
Triglycerides: 173 mg/dL — ABNORMAL HIGH (ref 0.0–149.0)
VLDL: 34.6 mg/dL (ref 0.0–40.0)

## 2020-07-30 ENCOUNTER — Ambulatory Visit (INDEPENDENT_AMBULATORY_CARE_PROVIDER_SITE_OTHER): Payer: No Typology Code available for payment source | Admitting: Internal Medicine

## 2020-07-30 ENCOUNTER — Encounter: Payer: Self-pay | Admitting: Internal Medicine

## 2020-07-30 ENCOUNTER — Other Ambulatory Visit: Payer: Self-pay

## 2020-07-30 VITALS — BP 128/70 | HR 71 | Temp 98.5°F | Ht 70.0 in | Wt 221.6 lb

## 2020-07-30 DIAGNOSIS — E785 Hyperlipidemia, unspecified: Secondary | ICD-10-CM | POA: Diagnosis not present

## 2020-07-30 DIAGNOSIS — Z0000 Encounter for general adult medical examination without abnormal findings: Secondary | ICD-10-CM

## 2020-07-30 DIAGNOSIS — I2510 Atherosclerotic heart disease of native coronary artery without angina pectoris: Secondary | ICD-10-CM

## 2020-07-30 DIAGNOSIS — I2583 Coronary atherosclerosis due to lipid rich plaque: Secondary | ICD-10-CM | POA: Diagnosis not present

## 2020-07-30 MED ORDER — ATORVASTATIN CALCIUM 40 MG PO TABS: 40.0000 mg | ORAL_TABLET | Freq: Every day | ORAL | 3 refills | Status: DC

## 2020-07-30 NOTE — Assessment & Plan Note (Signed)
Increase Lipitor to 40 mg/d to accomplish our goal LDL of 70 or less

## 2020-07-30 NOTE — Assessment & Plan Note (Addendum)
Increase Lipitor to 40 mg/d to accomplish our goal LDL of 70 or less

## 2020-07-30 NOTE — Assessment & Plan Note (Signed)
We discussed age appropriate health related issues, including available/recomended screening tests and vaccinations. Labs were ordered to be later reviewed . All questions were answered. We discussed one or more of the following - seat belt use, use of sunscreen/sun exposure exercise, safe sex, fall risk reduction, second hand smoke exposure, firearm use and storage, seat belt use, a need for adhering to healthy diet and exercise. Labs were ordered.  All questions were answered. A cardiac CT scan for calcium score is 54 in 2020 Colon 2019 Colon 2015

## 2020-07-30 NOTE — Progress Notes (Signed)
Subjective:  Patient ID: Adrian Cruz, male    DOB: 12-17-1965  Age: 55 y.o. MRN: [id number]  CC: Annual Exam   HPI Adrian Cruz presents for a well exam Follow-up for coronary atherosclerosis, dyslipidemia.  He has been taking Lipitor 20 mg daily  Outpatient Medications Prior to Visit  Medication Sig Dispense Refill  . atorvastatin (LIPITOR) 20 MG tablet Take 1 tablet (20 mg total) by mouth daily. 90 tablet 3  . Cholecalciferol (VITAMIN D3) 1000 UNITS CAPS Take by mouth daily.    Marland Kitchen loratadine (CLARITIN) 10 MG tablet Take 1 tablet (10 mg total) by mouth daily. 30 tablet 11  . pantoprazole (PROTONIX) 40 MG tablet Take 1 tablet (40 mg total) by mouth 2 (two) times daily. 60 tablet 1  . ciclopirox (PENLAC) 8 % solution Apply topically at bedtime. Apply over nail and surrounding skin qd (Patient not taking: Reported on 07/30/2020) 6.6 mL 0  . fluconazole (DIFLUCAN) 100 MG tablet Take 2 tabs on day#1, then 1 tab daily on Days #2-10 (Patient not taking: Reported on 07/30/2020) 11 tablet 1  . nystatin (MYCOSTATIN) 100000 UNIT/ML suspension Take 5 cc's by mouth, Swish and swallow. (Patient not taking: Reported on 07/30/2020) 280 mL 1  . triamcinolone ointment (KENALOG) 0.1 % Apply 1 application topically 2 (two) times daily. (Patient not taking: Reported on 07/30/2020) 30 g 2  . valACYclovir (VALTREX) 500 MG tablet Take 1 tablet (500 mg total) by mouth 2 (two) times daily. (Patient not taking: Reported on 07/30/2020) 60 tablet 5  . 0.9 %  sodium chloride infusion      No facility-administered medications prior to visit.    ROS: Review of Systems  Constitutional: Negative for appetite change, fatigue and unexpected weight change.  HENT: Negative for congestion, nosebleeds, sneezing, sore throat and trouble swallowing.   Eyes: Negative for itching and visual disturbance.  Respiratory: Negative for cough.   Cardiovascular: Negative for chest pain, palpitations and leg swelling.   Gastrointestinal: Negative for abdominal distention, blood in stool, diarrhea and nausea.  Genitourinary: Negative for frequency and hematuria.  Musculoskeletal: Negative for back pain, gait problem, joint swelling and neck pain.  Skin: Negative for rash.  Neurological: Negative for dizziness, tremors, speech difficulty and weakness.  Psychiatric/Behavioral: Negative for agitation, dysphoric mood, sleep disturbance and suicidal ideas. The patient is not nervous/anxious.     Objective:  BP 128/70 (BP Location: Left Arm)   Pulse 71   Temp 98.5 F (36.9 C) (Oral)   Ht 5\' 10"  (1.778 m)   Wt 221 lb 9.6 oz (100.5 kg)   SpO2 98%   BMI 31.80 kg/m   BP Readings from Last 3 Encounters:  07/30/20 128/70  12/21/19 120/78  09/26/19 112/66    Wt Readings from Last 3 Encounters:  07/30/20 221 lb 9.6 oz (100.5 kg)  12/21/19 (!) 211 lb (95.7 kg)  09/26/19 213 lb (96.6 kg)    Physical Exam Constitutional:      General: He is not in acute distress.    Appearance: He is well-developed.     Comments: NAD  HENT:     Mouth/Throat:     Mouth: Oropharynx is clear and moist.  Eyes:     Conjunctiva/sclera: Conjunctivae normal.     Pupils: Pupils are equal, round, and reactive to light.  Neck:     Thyroid: No thyromegaly.     Vascular: No JVD.  Cardiovascular:     Rate and Rhythm: Normal rate and regular rhythm.  Pulses: Intact distal pulses.     Heart sounds: Normal heart sounds. No murmur heard. No friction rub. No gallop.   Pulmonary:     Effort: Pulmonary effort is normal. No respiratory distress.     Breath sounds: Normal breath sounds. No wheezing or rales.  Chest:     Chest wall: No tenderness.  Abdominal:     General: Bowel sounds are normal. There is no distension.     Palpations: Abdomen is soft. There is no mass.     Tenderness: There is no abdominal tenderness. There is no guarding or rebound.  Musculoskeletal:        General: No tenderness or edema. Normal range of  motion.     Cervical back: Normal range of motion.  Lymphadenopathy:     Cervical: No cervical adenopathy.  Skin:    General: Skin is warm and dry.     Findings: No rash.  Neurological:     Mental Status: He is alert and oriented to person, place, and time.     Cranial Nerves: No cranial nerve deficit.     Motor: No abnormal muscle tone.     Coordination: He displays a negative Romberg sign. Coordination normal.     Gait: Gait normal.     Deep Tendon Reflexes: Reflexes are normal and symmetric.  Psychiatric:        Mood and Affect: Mood and affect normal.        Behavior: Behavior normal.        Thought Content: Thought content normal.        Judgment: Judgment normal.   The patient is stressed He declined prostate exam. Lab Results  Component Value Date   WBC 7.3 07/25/2020   HGB 14.4 07/25/2020   HCT 42.1 07/25/2020   PLT 267.0 07/25/2020   GLUCOSE 103 (H) 07/25/2020   CHOL 224 (H) 07/25/2020   TRIG 173.0 (H) 07/25/2020   HDL 49.50 07/25/2020   LDLDIRECT 198.4 11/28/2012   LDLCALC 140 (H) 07/25/2020   ALT 20 07/25/2020   AST 19 07/25/2020   NA 140 07/25/2020   K 4.1 07/25/2020   CL 106 07/25/2020   CREATININE 0.97 07/25/2020   BUN 16 07/25/2020   CO2 25 07/25/2020   TSH 2.85 07/25/2020   PSA 1.49 07/25/2020   INR 1.1 (H) 04/28/2017   HGBA1C 5.5 06/02/2017    CT CARDIAC SCORING  Addendum Date: 02/28/2019   ADDENDUM REPORT: 02/28/2019 10:02 CLINICAL DATA:  Risk stratification EXAM: Coronary Calcium Score TECHNIQUE: The patient was scanned on a [employer] scanner. Axial non-contrast 3 mm slices were carried out through the heart. The data set was analyzed on a dedicated work station and scored using the [place]. FINDINGS: Non-cardiac: See separate report from [facility]. Ascending Aorta: Normal caliber.  Trace aortic root calcifications. Pericardium: Normal. Coronary arteries: Normal origins. IMPRESSION: Coronary calcium score of 54. This was 72nd  percentile for age and sex matched control. Eleonore Chiquito, MD Electronically Signed   By: Eleonore Chiquito   On: 02/28/2019 10:02   Result Date: 02/28/2019 EXAM: OVER-READ INTERPRETATION  CT CHEST The following report is an over-read performed by radiologist Dr. Vinnie Langton of [facility], [place] on 02/28/2019. This over-read does not include interpretation of cardiac or coronary anatomy or pathology. The coronary calcium score/coronary CTA interpretation by the cardiologist is attached. COMPARISON:  None. FINDINGS: 3 mm pulmonary nodule in the periphery of the left lower lobe (axial image 35 of series 3),  stable dating back to prior CT the abdomen and pelvis 02/21/2005, considered definitively benign. Within the visualized portions of the thorax there are no other larger more suspicious appearing pulmonary nodules or masses, there is no acute consolidative airspace disease, no pleural effusions, no pneumothorax and no lymphadenopathy. Visualized portions of the upper abdomen are unremarkable. There are no aggressive appearing lytic or blastic lesions noted in the visualized portions of the skeleton. IMPRESSION: No significant incidental noncardiac findings are noted. Electronically Signed: By: Vinnie Langton M.D. On: 02/28/2019 09:53    Assessment & Plan:    Walker Kehr, MD

## 2020-09-26 ENCOUNTER — Encounter: Payer: BC Managed Care – PPO | Admitting: Internal Medicine

## 2020-10-11 ENCOUNTER — Other Ambulatory Visit: Payer: Self-pay | Admitting: Internal Medicine

## 2021-01-14 ENCOUNTER — Other Ambulatory Visit: Payer: Self-pay | Admitting: Internal Medicine

## 2021-01-14 MED ORDER — ATORVASTATIN CALCIUM 20 MG PO TABS: 20.0000 mg | ORAL_TABLET | Freq: Every day | ORAL | 3 refills | Status: DC

## 2021-01-14 MED ORDER — CICLOPIROX 8 % EX SOLN: Freq: Every day | CUTANEOUS | 1 refills | Status: DC

## 2021-09-01 ENCOUNTER — Encounter: Payer: Self-pay | Admitting: Internal Medicine

## 2021-09-01 DIAGNOSIS — E785 Hyperlipidemia, unspecified: Secondary | ICD-10-CM

## 2021-09-01 DIAGNOSIS — Z0000 Encounter for general adult medical examination without abnormal findings: Secondary | ICD-10-CM

## 2021-09-01 DIAGNOSIS — I2510 Atherosclerotic heart disease of native coronary artery without angina pectoris: Secondary | ICD-10-CM

## 2021-09-01 DIAGNOSIS — Z125 Encounter for screening for malignant neoplasm of prostate: Secondary | ICD-10-CM

## 2021-09-03 ENCOUNTER — Other Ambulatory Visit (INDEPENDENT_AMBULATORY_CARE_PROVIDER_SITE_OTHER): Payer: No Typology Code available for payment source

## 2021-09-03 DIAGNOSIS — Z125 Encounter for screening for malignant neoplasm of prostate: Secondary | ICD-10-CM | POA: Diagnosis not present

## 2021-09-03 DIAGNOSIS — I2583 Coronary atherosclerosis due to lipid rich plaque: Secondary | ICD-10-CM

## 2021-09-03 DIAGNOSIS — Z0000 Encounter for general adult medical examination without abnormal findings: Secondary | ICD-10-CM

## 2021-09-03 DIAGNOSIS — I2510 Atherosclerotic heart disease of native coronary artery without angina pectoris: Secondary | ICD-10-CM | POA: Diagnosis not present

## 2021-09-03 DIAGNOSIS — E785 Hyperlipidemia, unspecified: Secondary | ICD-10-CM | POA: Diagnosis not present

## 2021-09-03 LAB — COMPREHENSIVE METABOLIC PANEL
ALT: 18 U/L (ref 0–53)
AST: 16 U/L (ref 0–37)
Albumin: 4.4 g/dL (ref 3.5–5.2)
Alkaline Phosphatase: 53 U/L (ref 39–117)
BUN: 18 mg/dL (ref 6–23)
CO2: 26 mEq/L (ref 19–32)
Calcium: 8.8 mg/dL (ref 8.4–10.5)
Chloride: 104 mEq/L (ref 96–112)
Creatinine, Ser: 0.93 mg/dL (ref 0.40–1.50)
GFR: 90.81 mL/min (ref >60.00)
Glucose, Bld: 90 mg/dL (ref 70–99)
Potassium: 4.1 mEq/L (ref 3.5–5.1)
Sodium: 139 mEq/L (ref 135–145)
Total Bilirubin: 0.8 mg/dL (ref 0.2–1.2)
Total Protein: 7 g/dL (ref 6.0–8.3)

## 2021-09-03 LAB — URINALYSIS, ROUTINE W REFLEX MICROSCOPIC
Bilirubin Urine: NEGATIVE
Hgb urine dipstick: NEGATIVE
Ketones, ur: NEGATIVE
Leukocytes,Ua: NEGATIVE
Nitrite: NEGATIVE
RBC / HPF: NONE SEEN (ref 0–?)
Specific Gravity, Urine: 1.015 (ref 1.000–1.030)
Total Protein, Urine: NEGATIVE
Urine Glucose: NEGATIVE
Urobilinogen, UA: 0.2 (ref 0.0–1.0)
WBC, UA: NONE SEEN (ref 0–?)
pH: 6 (ref 5.0–8.0)

## 2021-09-03 LAB — TSH: TSH: 2.56 u[IU]/mL (ref 0.35–5.50)

## 2021-09-03 LAB — LIPID PANEL
Cholesterol: 321 mg/dL — ABNORMAL HIGH (ref 0–200)
HDL: 54.8 mg/dL (ref >39.00)
LDL Cholesterol: 248 mg/dL — ABNORMAL HIGH (ref 0–99)
NonHDL: 266.68
Total CHOL/HDL Ratio: 6
Triglycerides: 93 mg/dL (ref 0.0–149.0)
VLDL: 18.6 mg/dL (ref 0.0–40.0)

## 2021-09-03 LAB — BASIC METABOLIC PANEL
BUN: 18 mg/dL (ref 6–23)
CO2: 26 mEq/L (ref 19–32)
Calcium: 8.8 mg/dL (ref 8.4–10.5)
Chloride: 104 mEq/L (ref 96–112)
Creatinine, Ser: 0.93 mg/dL (ref 0.40–1.50)
GFR: 90.81 mL/min (ref >60.00)
Glucose, Bld: 90 mg/dL (ref 70–99)
Potassium: 4.1 mEq/L (ref 3.5–5.1)
Sodium: 139 mEq/L (ref 135–145)

## 2021-09-03 LAB — PSA: PSA: 2.21 ng/mL (ref 0.10–4.00)

## 2021-09-05 ENCOUNTER — Other Ambulatory Visit: Payer: No Typology Code available for payment source

## 2021-09-15 ENCOUNTER — Other Ambulatory Visit: Payer: No Typology Code available for payment source

## 2021-09-18 ENCOUNTER — Encounter: Payer: Self-pay | Admitting: Internal Medicine

## 2021-09-18 ENCOUNTER — Ambulatory Visit (INDEPENDENT_AMBULATORY_CARE_PROVIDER_SITE_OTHER): Payer: No Typology Code available for payment source | Admitting: Internal Medicine

## 2021-09-18 VITALS — BP 118/72 | HR 64 | Temp 98.0°F | Ht 70.0 in | Wt 211.0 lb

## 2021-09-18 DIAGNOSIS — Z0000 Encounter for general adult medical examination without abnormal findings: Secondary | ICD-10-CM

## 2021-09-18 DIAGNOSIS — E785 Hyperlipidemia, unspecified: Secondary | ICD-10-CM

## 2021-09-18 MED ORDER — ATORVASTATIN CALCIUM 20 MG PO TABS: 20.0000 mg | ORAL_TABLET | Freq: Every day | ORAL | 3 refills | Status: DC

## 2021-09-18 NOTE — Assessment & Plan Note (Signed)

## 2021-09-18 NOTE — Assessment & Plan Note (Addendum)
Worse off Lipitor ?Restarted Lipitor ?Restarted Lipitor. Insurance does not cover 40 mg (only 20 mg) ?

## 2021-09-18 NOTE — Progress Notes (Signed)
? ?Subjective:  ?Patient ID: Adrian Cruz, male    DOB: March 07, 1966  Age: 56 y.o. MRN: [id number] ? ?CC: Annual Exam ? ? ?HPI ?Adrian Cruz presents for a well exam ? ?Outpatient Medications Prior to Visit  ?Medication Sig Dispense Refill  ? atorvastatin (LIPITOR) 40 MG tablet Take 1 tablet (40 mg total) by mouth daily. 90 tablet 3  ? Cholecalciferol (VITAMIN D3) 1000 UNITS CAPS Take by mouth daily.    ? ciclopirox (PENLAC) 8 % solution Apply topically at bedtime. Apply over nail and surrounding skin. Apply daily over previous coat. After seven (7) days, may remove with alcohol and continue cycle. 6.6 mL 1  ? loratadine (CLARITIN) 10 MG tablet Take 1 tablet (10 mg total) by mouth daily. 30 tablet 11  ? pantoprazole (PROTONIX) 40 MG tablet Take 1 tablet (40 mg total) by mouth 2 (two) times daily. 60 tablet 1  ? atorvastatin (LIPITOR) 20 MG tablet Take 1 tablet (20 mg total) by mouth daily. (Patient not taking: Reported on 09/18/2021) 90 tablet 3  ? ?No facility-administered medications prior to visit.  ? ? ?ROS: ?Review of Systems  ?Constitutional:  Negative for appetite change, fatigue and unexpected weight change.  ?HENT:  Negative for congestion, nosebleeds, sneezing, sore throat and trouble swallowing.   ?Eyes:  Negative for itching and visual disturbance.  ?Respiratory:  Negative for cough.   ?Cardiovascular:  Negative for chest pain, palpitations and leg swelling.  ?Gastrointestinal:  Negative for abdominal distention, blood in stool, diarrhea and nausea.  ?Genitourinary:  Negative for frequency and hematuria.  ?Musculoskeletal:  Negative for back pain, gait problem, joint swelling and neck pain.  ?Skin:  Negative for rash.  ?Neurological:  Negative for dizziness, tremors, speech difficulty and weakness.  ?Psychiatric/Behavioral:  Negative for agitation, dysphoric mood, sleep disturbance and suicidal ideas. The patient is not nervous/anxious.   ? ?Objective:  ?BP 118/72 (BP Location: Left Arm)   Pulse  64   Temp 98 ?F (36.7 ?C) (Oral)   Ht '5\' 10"'$  (1.778 m)   Wt 211 lb (95.7 kg)   SpO2 97%   BMI 30.28 kg/m?  ? ?BP Readings from Last 3 Encounters:  ?09/18/21 118/72  ?07/30/20 128/70  ?12/21/19 120/78  ? ? ?Wt Readings from Last 3 Encounters:  ?09/18/21 211 lb (95.7 kg)  ?07/30/20 221 lb 9.6 oz (100.5 kg)  ?12/21/19 (!) 211 lb (95.7 kg)  ? ? ?Physical Exam ?Constitutional:   ?   General: He is not in acute distress. ?   Appearance: He is well-developed.  ?   Comments: NAD  ?Eyes:  ?   Conjunctiva/sclera: Conjunctivae normal.  ?   Pupils: Pupils are equal, round, and reactive to light.  ?Neck:  ?   Thyroid: No thyromegaly.  ?   Vascular: No JVD.  ?Cardiovascular:  ?   Rate and Rhythm: Normal rate and regular rhythm.  ?   Heart sounds: Normal heart sounds. No murmur heard. ?  No friction rub. No gallop.  ?Pulmonary:  ?   Effort: Pulmonary effort is normal. No respiratory distress.  ?   Breath sounds: Normal breath sounds. No wheezing or rales.  ?Chest:  ?   Chest wall: No tenderness.  ?Abdominal:  ?   General: Bowel sounds are normal. There is no distension.  ?   Palpations: Abdomen is soft. There is no mass.  ?   Tenderness: There is no abdominal tenderness. There is no guarding or rebound.  ?Musculoskeletal:     ?  General: No tenderness. Normal range of motion.  ?   Cervical back: Normal range of motion.  ?Lymphadenopathy:  ?   Cervical: No cervical adenopathy.  ?Skin: ?   General: Skin is warm and dry.  ?   Findings: No rash.  ?Neurological:  ?   Mental Status: He is alert and oriented to person, place, and time.  ?   Cranial Nerves: No cranial nerve deficit.  ?   Motor: No abnormal muscle tone.  ?   Coordination: Coordination normal.  ?   Gait: Gait normal.  ?   Deep Tendon Reflexes: Reflexes are normal and symmetric.  ?Psychiatric:     ?   Behavior: Behavior normal.     ?   Thought Content: Thought content normal.     ?   Judgment: Judgment normal.  ?Pt declined rectal exam ? ?Lab Results  ?Component Value  Date  ? WBC 7.3 07/25/2020  ? HGB 14.4 07/25/2020  ? HCT 42.1 07/25/2020  ? PLT 267.0 07/25/2020  ? GLUCOSE 90 09/03/2021  ? GLUCOSE 90 09/03/2021  ? CHOL 321 (H) 09/03/2021  ? TRIG 93.0 09/03/2021  ? HDL 54.80 09/03/2021  ? LDLDIRECT 198.4 11/28/2012  ? LDLCALC 248 (H) 09/03/2021  ? ALT 18 09/03/2021  ? AST 16 09/03/2021  ? NA 139 09/03/2021  ? NA 139 09/03/2021  ? K 4.1 09/03/2021  ? K 4.1 09/03/2021  ? CL 104 09/03/2021  ? CL 104 09/03/2021  ? CREATININE 0.93 09/03/2021  ? CREATININE 0.93 09/03/2021  ? BUN 18 09/03/2021  ? BUN 18 09/03/2021  ? CO2 26 09/03/2021  ? CO2 26 09/03/2021  ? TSH 2.56 09/03/2021  ? PSA 2.21 09/03/2021  ? INR 1.1 (H) 04/28/2017  ? HGBA1C 5.5 06/02/2017  ? ? ?CT CARDIAC SCORING ? ?Addendum Date: 02/28/2019   ?ADDENDUM REPORT: 02/28/2019 10:02 CLINICAL DATA:  Risk stratification EXAM: Coronary Calcium Score TECHNIQUE: The patient was scanned on a [employer]. Axial non-contrast 3 mm slices were carried out through the heart. The data set was analyzed on a dedicated work station and scored using the [place]. FINDINGS: Non-cardiac: See separate report from [facility]. Ascending Aorta: Normal caliber.  Trace aortic root calcifications. Pericardium: Normal. Coronary arteries: Normal origins. IMPRESSION: Coronary calcium score of 54. This was 72nd percentile for age and sex matched control. Adrian Chiquito, MD Electronically Signed   By: Adrian Cruz   On: 02/28/2019 10:02  ? ?Result Date: 02/28/2019 ?EXAM: OVER-READ INTERPRETATION  CT CHEST The following report is an over-read performed by radiologist Dr. Vinnie Langton of [facility], [place] on 02/28/2019. This over-read does not include interpretation of cardiac or coronary anatomy or pathology. The coronary calcium score/coronary CTA interpretation by the cardiologist is attached. COMPARISON:  None. FINDINGS: 3 mm pulmonary nodule in the periphery of the left lower lobe (axial image 35 of series 3), stable  dating back to prior CT the abdomen and pelvis 02/21/2005, considered definitively benign. Within the visualized portions of the thorax there are no other larger more suspicious appearing pulmonary nodules or masses, there is no acute consolidative airspace disease, no pleural effusions, no pneumothorax and no lymphadenopathy. Visualized portions of the upper abdomen are unremarkable. There are no aggressive appearing lytic or blastic lesions noted in the visualized portions of the skeleton. IMPRESSION: No significant incidental noncardiac findings are noted. Electronically Signed: By: Vinnie Langton M.D. On: 02/28/2019 09:53  ? ? ?Assessment & Plan:  ? ?Problem List Items Addressed This  Visit   ? ? Dyslipidemia  ?  Worse off Lipitor ?Restarted Lipitor ?Restarted Lipitor. Insurance does not cover 40 mg (only 20 mg) ?  ?  ?  ? ? ?No orders of the defined types were placed in this encounter. ?  ? ? ?Follow-up: Return in about 1 year (around 09/19/2022) for Wellness Exam. ? ?Walker Kehr, MD ?

## 2022-02-12 ENCOUNTER — Ambulatory Visit (INDEPENDENT_AMBULATORY_CARE_PROVIDER_SITE_OTHER): Payer: No Typology Code available for payment source | Admitting: Family Medicine

## 2022-02-12 ENCOUNTER — Encounter: Payer: Self-pay | Admitting: Family Medicine

## 2022-02-12 VITALS — BP 134/90 | HR 60 | Temp 97.6°F | Ht 70.0 in | Wt 220.0 lb

## 2022-02-12 DIAGNOSIS — H60331 Swimmer's ear, right ear: Secondary | ICD-10-CM | POA: Diagnosis not present

## 2022-02-12 MED ORDER — AMOXICILLIN 875 MG PO TABS: 875.0000 mg | ORAL_TABLET | Freq: Two times a day (BID) | ORAL | 0 refills | Status: AC

## 2022-02-12 MED ORDER — CIPROFLOXACIN-DEXAMETHASONE 0.3-0.1 % OT SUSP: 4.0000 [drp] | Freq: Two times a day (BID) | OTIC | 0 refills | Status: DC

## 2022-02-12 NOTE — Patient Instructions (Signed)
Take the antibiotics and use the drops as prescribed for the next 7-10 days.    Otitis Externa  Otitis externa is an infection of the outer ear canal. The outer ear canal is the area between the outside of the ear and the eardrum. Otitis externa is sometimes called swimmer's ear. What are the causes? Common causes of this condition include: Swimming in dirty water. Moisture in the ear. An injury to the inside of the ear. An object stuck in the ear. A cut or scrape on the outside of the ear or in the ear canal. What increases the risk? You are more likely to get this condition if you go swimming often. What are the signs or symptoms? Itching in the ear. This is often the first symptom. Swelling of the ear. Redness in the ear. Ear pain. The pain may get worse when you pull on your ear. Pus coming from the ear. How is this treated? This condition may be treated with: Antibiotic ear drops. These are often given for 10-14 days. Medicines to reduce itching and swelling. Follow these instructions at home: If you were prescribed antibiotic ear drops, use them as told by your doctor. Do not stop using them even if you start to feel better. Take over-the-counter and prescription medicines only as told by your doctor. Avoid getting water in your ears as told by your doctor. You may be told to avoid swimming or water sports for a few days. Keep all follow-up visits. How is this prevented? Keep your ears dry. Use the corner of a towel to dry your ears after you swim or bathe. Try not to scratch or put things in your ear. Doing these things makes it easier for germs to grow in your ear. Avoid swimming in lakes, dirty water, or swimming pools that may not have the right amount of a chemical called chlorine. Contact a doctor if: You have a fever. Your ear is still red, swollen, or painful after 3 days. You still have pus coming from your ear after 3 days. Your redness, swelling, or pain gets  worse. You have a very bad headache. Get help right away if: You have redness, swelling, and pain or tenderness behind your ear. Summary Otitis externa is an infection of the outer ear canal. Symptoms include pain, redness, and swelling of the ear. If you were prescribed antibiotic ear drops, use them as told by your doctor. Do not stop using them even if you start to feel better. Try not to scratch or put things in your ear. This information is not intended to replace advice given to you by your health care provider. Make sure you discuss any questions you have with your health care provider. Document Revised: 07/24/2020 Document Reviewed: 07/24/2020 Elsevier Patient Education  [place].

## 2022-02-12 NOTE — Progress Notes (Signed)
Subjective:  Adrian Cruz is a 56 y.o. male who presents for right ear feeling clogged and deceased hearing. He has been swimming a lot and using spas recently.  Denies fever, chills, headache, dizziness, rhinorrhea, sore throat, cough.   ROS as in subjective.   Objective: Vitals:   02/12/22 1335  BP: (!) 134/90  Pulse: 60  Temp: 97.6 F (36.4 C)  SpO2: 97%    General appearance: Alert, WD/WN, no distress, well appearing                             Skin: warm, no rash                           Head: no sinus tenderness                            Eyes: conjunctiva normal, corneas clear, PERRLA                            Ears: Right ear canal with edema, erythema and unable to completely visualize right TM.  Left TM and, external ear canal normal.  Denies tenderness.  No mastoid tenderness.                          Nose: septum midline             Mouth/throat: MMM, tongue normal                           Neck: supple, no adenopathy, no thyromegaly, nontender                          Heart: RRR                         Lungs: CTA bilaterally, no wheezes, rales, or rhonchi      Assessment: Acute swimmer's ear of right side - Plan: amoxicillin (AMOXIL) 875 MG tablet, ciprofloxacin-dexamethasone (CIPRODEX) OTIC suspension   Plan: Discussed diagnosis and treatment acute otitis externa.  Amoxicillin and Ciprodex otic prescribed.  Avoid submersion of his head or swimming.  Follow-up as needed.

## 2022-06-10 ENCOUNTER — Encounter: Payer: Self-pay | Admitting: Internal Medicine

## 2022-09-10 ENCOUNTER — Encounter: Payer: Self-pay | Admitting: Internal Medicine

## 2022-09-10 DIAGNOSIS — Z125 Encounter for screening for malignant neoplasm of prostate: Secondary | ICD-10-CM

## 2022-09-10 DIAGNOSIS — I2510 Atherosclerotic heart disease of native coronary artery without angina pectoris: Secondary | ICD-10-CM

## 2022-09-10 DIAGNOSIS — E785 Hyperlipidemia, unspecified: Secondary | ICD-10-CM

## 2022-09-10 DIAGNOSIS — Z0000 Encounter for general adult medical examination without abnormal findings: Secondary | ICD-10-CM

## 2022-09-14 ENCOUNTER — Other Ambulatory Visit: Payer: No Typology Code available for payment source

## 2022-09-17 ENCOUNTER — Other Ambulatory Visit (INDEPENDENT_AMBULATORY_CARE_PROVIDER_SITE_OTHER): Payer: No Typology Code available for payment source

## 2022-09-17 DIAGNOSIS — Z125 Encounter for screening for malignant neoplasm of prostate: Secondary | ICD-10-CM

## 2022-09-17 DIAGNOSIS — Z0000 Encounter for general adult medical examination without abnormal findings: Secondary | ICD-10-CM

## 2022-09-17 LAB — URINALYSIS, ROUTINE W REFLEX MICROSCOPIC
Bilirubin Urine: NEGATIVE
Hgb urine dipstick: NEGATIVE
Ketones, ur: NEGATIVE
Leukocytes,Ua: NEGATIVE
Nitrite: NEGATIVE
RBC / HPF: NONE SEEN (ref 0–?)
Specific Gravity, Urine: 1.025 (ref 1.000–1.030)
Total Protein, Urine: NEGATIVE
Urine Glucose: NEGATIVE
Urobilinogen, UA: 0.2 (ref 0.0–1.0)
pH: 6 (ref 5.0–8.0)

## 2022-09-17 LAB — BASIC METABOLIC PANEL
BUN: 17 mg/dL (ref 6–23)
CO2: 26 mEq/L (ref 19–32)
Calcium: 8.9 mg/dL (ref 8.4–10.5)
Chloride: 105 mEq/L (ref 96–112)
Creatinine, Ser: 0.93 mg/dL (ref 0.40–1.50)
GFR: 90.15 mL/min (ref >60.00)
Glucose, Bld: 101 mg/dL — ABNORMAL HIGH (ref 70–99)
Potassium: 4 mEq/L (ref 3.5–5.1)
Sodium: 140 mEq/L (ref 135–145)

## 2022-09-17 LAB — PSA: PSA: 2.64 ng/mL (ref 0.10–4.00)

## 2022-09-21 ENCOUNTER — Encounter: Payer: Self-pay | Admitting: Internal Medicine

## 2022-09-21 ENCOUNTER — Ambulatory Visit (INDEPENDENT_AMBULATORY_CARE_PROVIDER_SITE_OTHER): Payer: No Typology Code available for payment source | Admitting: Internal Medicine

## 2022-09-21 VITALS — BP 130/78 | HR 76 | Temp 98.6°F | Ht 70.0 in | Wt 218.0 lb

## 2022-09-21 DIAGNOSIS — E785 Hyperlipidemia, unspecified: Secondary | ICD-10-CM | POA: Diagnosis not present

## 2022-09-21 DIAGNOSIS — I2510 Atherosclerotic heart disease of native coronary artery without angina pectoris: Secondary | ICD-10-CM

## 2022-09-21 DIAGNOSIS — Z0000 Encounter for general adult medical examination without abnormal findings: Secondary | ICD-10-CM

## 2022-09-21 DIAGNOSIS — I2583 Coronary atherosclerosis due to lipid rich plaque: Secondary | ICD-10-CM

## 2022-09-21 DIAGNOSIS — R1011 Right upper quadrant pain: Secondary | ICD-10-CM

## 2022-09-21 HISTORY — DX: Right upper quadrant pain: R10.11

## 2022-09-21 MED ORDER — PANTOPRAZOLE SODIUM 40 MG PO TBEC: 40.0000 mg | DELAYED_RELEASE_TABLET | Freq: Two times a day (BID) | ORAL | 11 refills | Status: DC

## 2022-09-21 MED ORDER — ATORVASTATIN CALCIUM 10 MG PO TABS: 10.0000 mg | ORAL_TABLET | Freq: Every day | ORAL | 3 refills | Status: DC

## 2022-09-21 MED ORDER — ATORVASTATIN CALCIUM 20 MG PO TABS: 20.0000 mg | ORAL_TABLET | Freq: Every day | ORAL | 3 refills | Status: DC

## 2022-09-21 NOTE — Assessment & Plan Note (Signed)
Try low dose Lipitor QOD

## 2022-09-21 NOTE — Assessment & Plan Note (Addendum)
Coronary calcium CT score of 54 - 2020.  Pt stopped Lipitor due to GI side effects Try low dose Lipitor QOD

## 2022-09-21 NOTE — Progress Notes (Signed)
Subjective:  Patient ID: Adrian Cruz, male    DOB: 09/19/1965  Age: 57 y.o. MRN: [id number]  CC: Annual Exam (Physical)   HPI Adrian Cruz presents for a well exam C/o RUQ abd pain >1 month ago - pt stopped Lipitor 1 mo ago: no relapse   Outpatient Medications Prior to Visit  Medication Sig Dispense Refill   Cholecalciferol (VITAMIN D3) 1000 UNITS CAPS Take by mouth daily.     ciclopirox (PENLAC) 8 % solution Apply topically at bedtime. Apply over nail and surrounding skin. Apply daily over previous coat. After seven (7) days, may remove with alcohol and continue cycle. 6.6 mL 1   DENTA 5000 PLUS 1.1 % CREA dental cream Take by mouth at bedtime.     loratadine (CLARITIN) 10 MG tablet Take 1 tablet (10 mg total) by mouth daily. 30 tablet 11   atorvastatin (LIPITOR) 20 MG tablet Take 1 tablet (20 mg total) by mouth daily. 90 tablet 3   pantoprazole (PROTONIX) 40 MG tablet Take 1 tablet (40 mg total) by mouth 2 (two) times daily. 60 tablet 1   ciprofloxacin-dexamethasone (CIPRODEX) OTIC suspension Place 4 drops into the right ear 2 (two) times daily. 7.5 mL 0   No facility-administered medications prior to visit.    ROS: Review of Systems  Constitutional:  Negative for appetite change, fatigue and unexpected weight change.  HENT:  Negative for congestion, nosebleeds, sneezing, sore throat and trouble swallowing.   Eyes:  Negative for itching and visual disturbance.  Respiratory:  Negative for cough.   Cardiovascular:  Negative for chest pain, palpitations and leg swelling.  Gastrointestinal:  Negative for abdominal distention, blood in stool, diarrhea and nausea.  Genitourinary:  Negative for frequency and hematuria.  Musculoskeletal:  Negative for back pain, gait problem, joint swelling and neck pain.  Skin:  Negative for rash.  Neurological:  Negative for dizziness, tremors, speech difficulty and weakness.  Psychiatric/Behavioral:  Negative for agitation, dysphoric  mood and sleep disturbance. The patient is not nervous/anxious.     Objective:  BP 130/78 (BP Location: Left Arm, Patient Position: Sitting, Cuff Size: Large)   Pulse 76   Temp 98.6 F (37 C) (Oral)   Ht 5\' 10"  (1.778 m)   Wt 218 lb (98.9 kg)   SpO2 95%   BMI 31.28 kg/m   BP Readings from Last 3 Encounters:  09/21/22 130/78  02/12/22 (!) 134/90  09/18/21 118/72    Wt Readings from Last 3 Encounters:  09/21/22 218 lb (98.9 kg)  02/12/22 220 lb (99.8 kg)  09/18/21 211 lb (95.7 kg)    Physical Exam Constitutional:      General: He is not in acute distress.    Appearance: Normal appearance. He is well-developed.     Comments: NAD  Eyes:     Conjunctiva/sclera: Conjunctivae normal.     Pupils: Pupils are equal, round, and reactive to light.  Neck:     Thyroid: No thyromegaly.     Vascular: No JVD.  Cardiovascular:     Rate and Rhythm: Normal rate and regular rhythm.     Heart sounds: Normal heart sounds. No murmur heard.    No friction rub. No gallop.  Pulmonary:     Effort: Pulmonary effort is normal. No respiratory distress.     Breath sounds: Normal breath sounds. No wheezing or rales.  Chest:     Chest wall: No tenderness.  Abdominal:     General: Bowel sounds are normal. There is no  distension.     Palpations: Abdomen is soft. There is no mass.     Tenderness: There is no abdominal tenderness. There is no guarding or rebound.  Musculoskeletal:        General: No tenderness. Normal range of motion.     Cervical back: Normal range of motion.  Lymphadenopathy:     Cervical: No cervical adenopathy.  Skin:    General: Skin is warm and dry.     Findings: No rash.  Neurological:     Mental Status: He is alert and oriented to person, place, and time.     Cranial Nerves: No cranial nerve deficit.     Motor: No abnormal muscle tone.     Coordination: Coordination normal.     Gait: Gait normal.     Deep Tendon Reflexes: Reflexes are normal and symmetric.   Psychiatric:        Behavior: Behavior normal.        Thought Content: Thought content normal.        Judgment: Judgment normal.   Abd S/NT; no HSM  Lab Results  Component Value Date   WBC 7.3 07/25/2020   HGB 14.4 07/25/2020   HCT 42.1 07/25/2020   PLT 267.0 07/25/2020   GLUCOSE 101 (H) 09/17/2022   CHOL 321 (H) 09/03/2021   TRIG 93.0 09/03/2021   HDL 54.80 09/03/2021   LDLDIRECT 198.4 11/28/2012   LDLCALC 248 (H) 09/03/2021   ALT 18 09/03/2021   AST 16 09/03/2021   NA 140 09/17/2022   K 4.0 09/17/2022   CL 105 09/17/2022   CREATININE 0.93 09/17/2022   BUN 17 09/17/2022   CO2 26 09/17/2022   TSH 2.56 09/03/2021   PSA 2.64 09/17/2022   INR 1.1 (H) 04/28/2017   HGBA1C 5.5 06/02/2017    CT CARDIAC SCORING  Addendum Date: 02/28/2019   ADDENDUM REPORT: 02/28/2019 10:02 CLINICAL DATA:  Risk stratification EXAM: Coronary Calcium Score TECHNIQUE: The patient was scanned on a [employer] scanner. Axial non-contrast 3 mm slices were carried out through the heart. The data set was analyzed on a dedicated work station and scored using the Agatson method. FINDINGS: Non-cardiac: See separate report from [facility]. Ascending Aorta: Normal caliber.  Trace aortic root calcifications. Pericardium: Normal. Coronary arteries: Normal origins. IMPRESSION: Coronary calcium score of 54. This was 72nd percentile for age and sex matched control. Lennie Odor, MD Electronically Signed   By: Lennie Odor   On: 02/28/2019 10:02   Result Date: 02/28/2019 EXAM: OVER-READ INTERPRETATION  CT CHEST The following report is an over-read performed by radiologist Dr. Trudie Reed of [facility] on 02/28/2019. This over-read does not include interpretation of cardiac or coronary anatomy or pathology. The coronary calcium score/coronary CTA interpretation by the cardiologist is attached. COMPARISON:  None. FINDINGS: 3 mm pulmonary nodule in the periphery of the left lower lobe (axial  image 35 of series 3), stable dating back to prior CT the abdomen and pelvis 02/21/2005, considered definitively benign. Within the visualized portions of the thorax there are no other larger more suspicious appearing pulmonary nodules or masses, there is no acute consolidative airspace disease, no pleural effusions, no pneumothorax and no lymphadenopathy. Visualized portions of the upper abdomen are unremarkable. There are no aggressive appearing lytic or blastic lesions noted in the visualized portions of the skeleton. IMPRESSION: No significant incidental noncardiac findings are noted. Electronically Signed: By: Trudie Reed M.D. On: 02/28/2019 09:53    Assessment & Plan:  Problem List Items Addressed This Visit     Dyslipidemia    Coronary calcium CT score of 54 - 2020.  Pt stopped Lipitor due to GI side effects Try low dose Lipitor QOD      Relevant Medications   atorvastatin (LIPITOR) 10 MG tablet   Well adult exam - Primary    Coronary calcium CT score of 54 - 2020.  We discussed age appropriate health related issues, including available/recomended screening tests and vaccinations. Labs were ordered to be later reviewed . All questions were answered. We discussed one or more of the following - seat belt use, use of sunscreen/sun exposure exercise, safe sex, fall risk reduction, second hand smoke exposure, firearm use and storage, seat belt use, a need for adhering to healthy diet and exercise. Labs were ordered.  All questions were answered.      Relevant Orders   Comprehensive metabolic panel   TSH   Lipid panel   Coronary atherosclerosis    Try low dose Lipitor QOD      Relevant Medications   atorvastatin (LIPITOR) 10 MG tablet   RUQ pain    Likely due to meds - Try low dose Lipitor QOD Sx's resolved off Lipitor RUQ [place] if relapsed         Meds ordered this encounter  Medications   pantoprazole (PROTONIX) 40 MG tablet    Sig: Take 1 tablet (40 mg total) by  mouth 2 (two) times daily.    Dispense:  30 tablet    Refill:  11   DISCONTD: atorvastatin (LIPITOR) 20 MG tablet    Sig: Take 1 tablet (20 mg total) by mouth daily.    Dispense:  90 tablet    Refill:  3   atorvastatin (LIPITOR) 10 MG tablet    Sig: Take 1 tablet (10 mg total) by mouth daily.    Dispense:  90 tablet    Refill:  3      Follow-up: No follow-ups on file.  Sonda Primes, MD

## 2022-09-21 NOTE — Assessment & Plan Note (Signed)
Coronary calcium CT score of 54 - 2020.  We discussed age appropriate health related issues, including available/recomended screening tests and vaccinations. Labs were ordered to be later reviewed . All questions were answered. We discussed one or more of the following - seat belt use, use of sunscreen/sun exposure exercise, safe sex, fall risk reduction, second hand smoke exposure, firearm use and storage, seat belt use, a need for adhering to healthy diet and exercise. Labs were ordered.  All questions were answered.

## 2022-09-21 NOTE — Assessment & Plan Note (Signed)
Likely due to meds - Try low dose Lipitor QOD Sx's resolved off Lipitor RUQ [place] if relapsed

## 2022-12-11 ENCOUNTER — Encounter: Payer: Self-pay | Admitting: Internal Medicine

## 2022-12-11 ENCOUNTER — Telehealth: Payer: Self-pay | Admitting: Internal Medicine

## 2022-12-11 DIAGNOSIS — Z860100 Personal history of colon polyps, unspecified: Secondary | ICD-10-CM

## 2022-12-11 HISTORY — DX: Personal history of colon polyps, unspecified: Z86.0100

## 2022-12-11 NOTE — Telephone Encounter (Signed)
Due colonoscopy. He needs it ASAP due to potential loss of health insurance.

## 2022-12-14 ENCOUNTER — Other Ambulatory Visit: Payer: Self-pay | Admitting: Internal Medicine

## 2022-12-14 DIAGNOSIS — R1011 Right upper quadrant pain: Secondary | ICD-10-CM

## 2022-12-22 ENCOUNTER — Ambulatory Visit
Admission: RE | Admit: 2022-12-22 | Discharge: 2022-12-22 | Disposition: A | Discharge status: Home / Self Care | Payer: No Typology Code available for payment source | Source: Ambulatory Visit | Attending: Internal Medicine | Admitting: Internal Medicine

## 2022-12-22 ENCOUNTER — Other Ambulatory Visit: Payer: Self-pay | Admitting: Internal Medicine

## 2022-12-22 DIAGNOSIS — R1011 Right upper quadrant pain: Secondary | ICD-10-CM

## 2023-01-05 ENCOUNTER — Ambulatory Visit (AMBULATORY_SURGERY_CENTER): Payer: No Typology Code available for payment source

## 2023-01-05 VITALS — Ht 70.0 in | Wt 215.0 lb

## 2023-01-05 DIAGNOSIS — Z1211 Encounter for screening for malignant neoplasm of colon: Secondary | ICD-10-CM

## 2023-01-05 DIAGNOSIS — Z86010 Personal history of colonic polyps: Secondary | ICD-10-CM

## 2023-01-05 MED ORDER — NA SULFATE-K SULFATE-MG SULF 17.5-3.13-1.6 GM/177ML PO SOLN: 1.0000 | Freq: Once | ORAL | 0 refills | Status: AC

## 2023-01-05 NOTE — Progress Notes (Signed)
No egg or soy allergy known to patient   No issues known to pt with past sedation with any surgeries or procedures  Patient denies ever being told they had issues or difficulty with intubation   No FH of Malignant Hyperthermia  Pt is not on diet pills  Pt is not on  home 02   Pt is not on blood thinners   Pt denies issues with constipation   No A fib or A flutter  Have any cardiac testing pending--no Pt instructed to use Singlecare.com or GoodRx for a price reduction on prep     Patient's chart reviewed by John Nulty CRNA prior to previsit and patient appropriate for the [facility].  Previsit completed and red dot placed by patient's name on their procedure day (on provider's schedule).    

## 2023-01-08 ENCOUNTER — Telehealth: Payer: Self-pay | Admitting: Internal Medicine

## 2023-01-08 ENCOUNTER — Encounter: Payer: Self-pay | Admitting: Internal Medicine

## 2023-01-08 NOTE — Telephone Encounter (Signed)
Prep instructions revised and sent to patient via MyChart per his request;

## 2023-01-08 NOTE — Telephone Encounter (Signed)
Inbound call from patient requesting update prep instruction for 9/19 colonoscopy through mychart. Please advise, thank you

## 2023-01-11 NOTE — Telephone Encounter (Signed)
Inbound call from patient stating he has further questions regarding prep instructions. Patient requesting a call back. Please advise, thank you.

## 2023-01-13 ENCOUNTER — Telehealth: Payer: Self-pay

## 2023-01-13 NOTE — Telephone Encounter (Signed)
Spoke with patient about his history and denies any hx of polyps.  Diagnosis changed on his chart to reflect screening

## 2023-01-14 NOTE — Telephone Encounter (Signed)
Called patient back to let him know that AMB Referral had been changed to screening

## 2023-01-26 ENCOUNTER — Encounter: Payer: No Typology Code available for payment source | Admitting: Internal Medicine

## 2023-02-11 ENCOUNTER — Encounter: Payer: Self-pay | Admitting: Internal Medicine

## 2023-02-11 ENCOUNTER — Ambulatory Visit (AMBULATORY_SURGERY_CENTER): Payer: No Typology Code available for payment source | Admitting: Internal Medicine

## 2023-02-11 VITALS — BP 131/84 | HR 65 | Temp 97.2°F | Resp 20 | Ht 70.0 in | Wt 215.0 lb

## 2023-02-11 DIAGNOSIS — Z86010 Personal history of colonic polyps: Secondary | ICD-10-CM

## 2023-02-11 DIAGNOSIS — Z1211 Encounter for screening for malignant neoplasm of colon: Secondary | ICD-10-CM | POA: Diagnosis present

## 2023-02-11 MED ORDER — SODIUM CHLORIDE 0.9 % IV SOLN: 500.0000 mL | INTRAVENOUS | Status: DC

## 2023-02-11 NOTE — Patient Instructions (Addendum)
Resume previous diet Continue present medications There were no colon polyps seen today!  You will need another screening colonoscopy in 10 years, you will receive a letter at that time when you are due for the procedure.   Please call us at [phone] if you have a change in bowel habits, change in family history of colo-rectal cancer, rectal bleeding or other GI concern before that time.  Handouts/information given for diverticulosis   YOU HAD AN ENDOSCOPIC PROCEDURE TODAY AT THE [facility]:   Refer to the procedure report that was given to you for any specific questions about what was found during the examination.  If the procedure report does not answer your questions, please call your gastroenterologist to clarify.  If you requested that your care partner not be given the details of your procedure findings, then the procedure report has been included in a sealed envelope for you to review at your convenience later.  YOU SHOULD EXPECT: Some feelings of bloating in the abdomen. Passage of more gas than usual.  Walking can help get rid of the air that was put into your GI tract during the procedure and reduce the bloating. If you had a lower endoscopy (such as a colonoscopy or flexible sigmoidoscopy) you may notice spotting of blood in your stool or on the toilet paper. If you underwent a bowel prep for your procedure, you may not have a normal bowel movement for a few days.  Please Note:  You might notice some irritation and congestion in your nose or some drainage.  This is from the oxygen used during your procedure.  There is no need for concern and it should clear up in a day or so.  SYMPTOMS TO REPORT IMMEDIATELY:  Following lower endoscopy (colonoscopy):  Excessive amounts of blood in the stool  Significant tenderness or worsening of abdominal pains  Swelling of the abdomen that is new, acute  Fever of 100F or higher  For urgent or emergent issues, a gastroenterologist  can be reached at any hour by calling [phone]. Do not use MyChart messaging for urgent concerns.   DIET:  We do recommend a small meal at first, but then you may proceed to your regular diet.  Drink plenty of fluids but you should avoid alcoholic beverages for 24 hours.  ACTIVITY:  You should plan to take it easy for the rest of today and you should NOT DRIVE or use heavy machinery until tomorrow (because of the sedation medicines used during the test).    FOLLOW UP: Our staff will call the number listed on your records the next business day following your procedure.  We will call around 7:15- 8:00 am to check on you and address any questions or concerns that you may have regarding the information given to you following your procedure. If we do not reach you, we will leave a message.     SIGNATURES/CONFIDENTIALITY: You and/or your care partner have signed paperwork which will be entered into your electronic medical record.  These signatures attest to the fact that that the information above on your After Visit Summary has been reviewed and is understood.  Full responsibility of the confidentiality of this discharge information lies with you and/or your care-partner.

## 2023-02-11 NOTE — Progress Notes (Signed)
Pt's states no medical or surgical changes since previsit or office visit. 

## 2023-02-11 NOTE — Progress Notes (Signed)
HISTORY OF PRESENT ILLNESS:  Adrian Cruz is a 57 y.o. male with a history of adenomatous colon polyp 2015.  Negative examination 2019.  Presents today for surveillance colonoscopy  REVIEW OF SYSTEMS:  All non-GI ROS negative except for  Past Medical History:  Diagnosis Date   Dyslipidemia    Hyperlipidemia    Kidney stones 05/25/2005   h/o urology consult    Past Surgical History:  Procedure Laterality Date   CARPAL TUNNEL RELEASE     right wrist 2005   COLONOSCOPY  2019    Social History Adrian Cruz  reports that he has never smoked. He has never used smokeless tobacco. He reports current alcohol use of about 2.0 standard drinks of alcohol per week. He reports that he does not use drugs.  family history includes Hypertension in his father.  Allergies  Allergen Reactions   Aspirin     Blood in stool   Pravastatin     abd pain   Simvastatin     REACTION: rash       PHYSICAL EXAMINATION: Vital signs: BP (!) 145/89   Pulse 60   Temp (!) 97.2 F (36.2 C)   Resp 10   Ht 5\' 10"  (1.778 m)   Wt 215 lb (97.5 kg)   SpO2 100%   BMI 30.85 kg/m  General: Well-developed, well-nourished, no acute distress HEENT: Sclerae are anicteric, conjunctiva pink. Oral mucosa intact Lungs: Clear Heart: Regular Abdomen: soft, nontender, nondistended, no obvious ascites, no peritoneal signs, normal bowel sounds. No organomegaly. Extremities: No edema Psychiatric: alert and oriented x3. Cooperative     ASSESSMENT:   History of adenomatous polyp  PLAN:  Surveillance colonoscopy

## 2023-02-11 NOTE — Progress Notes (Signed)
Sedate, gd SR, tolerated procedure well, VSS, report to RN 

## 2023-02-11 NOTE — Op Note (Signed)
[facility] Patient Name: Adrian Cruz Procedure Date: 02/11/2023 1:19 PM MRN: [id number] Endoscopist: Wilhemina Bonito. Marina Goodell , MD, [id number] Age: 57 Referring MD:  Date of Birth: 10-Oct-1965 Gender: Male Account #: [id number] Procedure:                Colonoscopy Indications:              Screening colonoscopy. Prior 2019 (normal); 2015                            (TA) Medicines:                Monitored Anesthesia Care Procedure:                Pre-Anesthesia Assessment:                           - Prior to the procedure, a History and Physical                            was performed, and patient medications and                            allergies were reviewed. The patient's tolerance of                            previous anesthesia was also reviewed. The risks                            and benefits of the procedure and the sedation                            options and risks were discussed with the patient.                            All questions were answered, and informed consent                            was obtained. Prior Anticoagulants: The patient has                            taken no anticoagulant or antiplatelet agents. ASA                            Grade Assessment: II - A patient with mild systemic                            disease. After reviewing the risks and benefits,                            the patient was deemed in satisfactory condition to                            undergo the procedure.  After obtaining informed consent, the colonoscope                            was passed under direct vision. Throughout the                            procedure, the patient's blood pressure, pulse, and                            oxygen saturations were monitored continuously. The                            CF [id number] #[id number] was introduced through the anus                            and advanced to the the cecum, identified by                             appendiceal orifice and ileocecal valve. The                            ileocecal valve, appendiceal orifice, and rectum                            were photographed. The quality of the bowel                            preparation was excellent. The colonoscopy was                            performed without difficulty. The patient tolerated                            the procedure well. The bowel preparation used was                            SUPREP via split dose instruction. Scope In: 1:29:38 PM Scope Out: 1:41:03 PM Scope Withdrawal Time: 0 hours 9 minutes 43 seconds  Total Procedure Duration: 0 hours 11 minutes 25 seconds  Findings:                 A few diverticula were found in the sigmoid colon.                           The entire examined colon appeared otherwise normal                            on direct and retroflexion views. Complications:            No immediate complications. Estimated blood loss:                            None. Estimated Blood Loss:     Estimated blood loss: none. Impression:               -  Diverticulosis in the sigmoid colon.                           - The entire examined colon is otherwise normal on                            direct and retroflexion views.                           - No specimens collected. Recommendation:           - Repeat colonoscopy in 10 years for surveillance.                           - Patient has a contact number available for                            emergencies. The signs and symptoms of potential                            delayed complications were discussed with the                            patient. Return to normal activities tomorrow.                            Written discharge instructions were provided to the                            patient.                           - Resume previous diet.                           - Continue present medications. Wilhemina Bonito. Marina Goodell, MD 02/11/2023  2:07:03 PM This report has been signed electronically.

## 2023-02-15 ENCOUNTER — Telehealth: Payer: Self-pay

## 2023-02-15 NOTE — Telephone Encounter (Signed)
  Follow up Call-     02/11/2023    1:00 PM  Call back number  Post procedure Call Back phone  # [phone]  Permission to leave phone message Yes     Patient questions:  Do you have a fever, pain , or abdominal swelling? No. Pain Score  0 *  Have you tolerated food without any problems? Yes.    Have you been able to return to your normal activities? Yes.    Do you have any questions about your discharge instructions: Diet   No. Medications  No. Follow up visit  No.  Do you have questions or concerns about your Care? No.  Actions: * If pain score is 4 or above: No action needed, pain <4.

## 2023-09-14 ENCOUNTER — Encounter: Payer: Self-pay | Admitting: Internal Medicine

## 2023-09-28 ENCOUNTER — Other Ambulatory Visit (INDEPENDENT_AMBULATORY_CARE_PROVIDER_SITE_OTHER)

## 2023-09-28 ENCOUNTER — Other Ambulatory Visit: Payer: Self-pay | Admitting: Internal Medicine

## 2023-09-28 DIAGNOSIS — E785 Hyperlipidemia, unspecified: Secondary | ICD-10-CM

## 2023-09-28 DIAGNOSIS — Z125 Encounter for screening for malignant neoplasm of prostate: Secondary | ICD-10-CM

## 2023-09-28 DIAGNOSIS — Z0000 Encounter for general adult medical examination without abnormal findings: Secondary | ICD-10-CM

## 2023-09-28 LAB — LIPID PANEL
Cholesterol: 319 mg/dL — ABNORMAL HIGH (ref 0–200)
HDL: 44.3 mg/dL (ref >39.00)
LDL Cholesterol: 251 mg/dL — ABNORMAL HIGH (ref 0–99)
NonHDL: 274.96
Total CHOL/HDL Ratio: 7
Triglycerides: 120 mg/dL (ref 0.0–149.0)
VLDL: 24 mg/dL (ref 0.0–40.0)

## 2023-09-28 LAB — COMPREHENSIVE METABOLIC PANEL WITH GFR
ALT: 18 U/L (ref 0–53)
AST: 17 U/L (ref 0–37)
Albumin: 4.1 g/dL (ref 3.5–5.2)
Alkaline Phosphatase: 46 U/L (ref 39–117)
BUN: 15 mg/dL (ref 6–23)
CO2: 24 meq/L (ref 19–32)
Calcium: 8.7 mg/dL (ref 8.4–10.5)
Chloride: 106 meq/L (ref 96–112)
Creatinine, Ser: 0.84 mg/dL (ref 0.40–1.50)
GFR: 95.05 mL/min (ref >60.00)
Glucose, Bld: 101 mg/dL — ABNORMAL HIGH (ref 70–99)
Potassium: 3.8 meq/L (ref 3.5–5.1)
Sodium: 140 meq/L (ref 135–145)
Total Bilirubin: 0.6 mg/dL (ref 0.2–1.2)
Total Protein: 6.9 g/dL (ref 6.0–8.3)

## 2023-09-28 LAB — URINALYSIS
Bilirubin Urine: NEGATIVE
Hgb urine dipstick: NEGATIVE
Ketones, ur: NEGATIVE
Leukocytes,Ua: NEGATIVE
Nitrite: NEGATIVE
Specific Gravity, Urine: 1.02 (ref 1.000–1.030)
Total Protein, Urine: NEGATIVE
Urine Glucose: NEGATIVE
Urobilinogen, UA: 0.2 (ref 0.0–1.0)
pH: 6 (ref 5.0–8.0)

## 2023-09-28 LAB — CBC WITH DIFFERENTIAL/PLATELET
Basophils Absolute: 0.1 10*3/uL (ref 0.0–0.1)
Basophils Relative: 0.7 % (ref 0.0–3.0)
Eosinophils Absolute: 0.2 10*3/uL (ref 0.0–0.7)
Eosinophils Relative: 3.2 % (ref 0.0–5.0)
HCT: 44.4 % (ref 39.0–52.0)
Hemoglobin: 14.6 g/dL (ref 13.0–17.0)
Lymphocytes Relative: 50.4 % — ABNORMAL HIGH (ref 12.0–46.0)
Lymphs Abs: 3.4 10*3/uL (ref 0.7–4.0)
MCHC: 32.8 g/dL (ref 30.0–36.0)
MCV: 85.3 fl (ref 78.0–100.0)
Monocytes Absolute: 0.7 10*3/uL (ref 0.1–1.0)
Monocytes Relative: 10.4 % (ref 3.0–12.0)
Neutro Abs: 2.4 10*3/uL (ref 1.4–7.7)
Neutrophils Relative %: 35.3 % — ABNORMAL LOW (ref 43.0–77.0)
Platelets: 253 10*3/uL (ref 150.0–400.0)
RBC: 5.21 Mil/uL (ref 4.22–5.81)
RDW: 13.7 % (ref 11.5–15.5)
WBC: 6.8 10*3/uL (ref 4.0–10.5)

## 2023-09-28 LAB — TSH: TSH: 2.99 u[IU]/mL (ref 0.35–5.50)

## 2023-09-28 LAB — PSA: PSA: 1.99 ng/mL (ref 0.10–4.00)

## 2023-09-29 ENCOUNTER — Ambulatory Visit: Admitting: Internal Medicine

## 2023-09-29 ENCOUNTER — Encounter: Payer: Self-pay | Admitting: Internal Medicine

## 2023-09-29 VITALS — BP 122/84 | HR 73 | Temp 98.2°F | Ht 70.0 in | Wt 213.4 lb

## 2023-09-29 DIAGNOSIS — M940 Chondrocostal junction syndrome [Tietze]: Secondary | ICD-10-CM

## 2023-09-29 DIAGNOSIS — Z0001 Encounter for general adult medical examination with abnormal findings: Secondary | ICD-10-CM | POA: Diagnosis not present

## 2023-09-29 DIAGNOSIS — Z0000 Encounter for general adult medical examination without abnormal findings: Secondary | ICD-10-CM

## 2023-09-29 HISTORY — DX: Chondrocostal junction syndrome (tietze): M94.0

## 2023-09-29 MED ORDER — ATORVASTATIN CALCIUM 10 MG PO TABS
10.0000 mg | ORAL_TABLET | Freq: Every day | ORAL | 3 refills | Status: AC
Start: 2023-09-29 — End: ?

## 2023-09-29 NOTE — Assessment & Plan Note (Signed)
Coronary calcium CT score of 54 - 2020.  We discussed age appropriate health related issues, including available/recomended screening tests and vaccinations. Labs were ordered to be later reviewed . All questions were answered. We discussed one or more of the following - seat belt use, use of sunscreen/sun exposure exercise, safe sex, fall risk reduction, second hand smoke exposure, firearm use and storage, seat belt use, a need for adhering to healthy diet and exercise. Labs were ordered.  All questions were answered.

## 2023-09-29 NOTE — Patient Instructions (Signed)
 USEFUL THINGS FOR ARTHRITIS and musculoskeletal pains:    A "rice sock heating pad" refers to a homemade heating pad created by filling a sock with uncooked rice, which can be heated in a microwave to provide a warm compress for sore muscles, pain relief, or other applications; essentially, it's a simple way to generate heat using readily available materials.  Key points about rice sock heat: How to make it: Fill a clean sock (preferably a tube sock) about 2/3 full with uncooked rice, tie a knot at the top to secure the rice inside.  Heating it up: Place the rice sock in the microwave and heat in short intervals (usually around 30 seconds at a time) until it reaches the desired warmth.  Important considerations: Check temperature before applying: Always test the temperature of the rice sock before applying it to your skin to avoid burns.  Use a towel to protect skin: Wrap the rice sock in a thin towel to distribute the heat evenly and protect your skin.  Uses: Muscle aches and pains  Menstrual cramps  Neck pain  Arthritis discomfort      BLUE EMU CREAM: Use it 2-3 times a day on painful areas

## 2023-09-29 NOTE — Assessment & Plan Note (Signed)
 RUQ area RUQ pain since 2023 off and on. Not better off Lipitor Ice/heat Blue-Emu cream was recommended to use 2-3 times a day Ibuprofen prn

## 2023-09-29 NOTE — Progress Notes (Signed)
 Subjective:  Patient ID: Adrian Cruz, male    DOB: October 25, 1965  Age: 58 y.o. MRN: [id number]  CC: Annual Exam (Annual Exam)   HPI Adrian Cruz presents for a well exam C/o RUQ pain since 2023 off and on. Not better off Lipitor  Outpatient Medications Prior to Visit  Medication Sig Dispense Refill   Cholecalciferol (VITAMIN D3) 1000 UNITS CAPS Take by mouth daily.     ciclopirox  (PENLAC ) 8 % solution Apply topically at bedtime. Apply over nail and surrounding skin. Apply daily over previous coat. After seven (7) days, may remove with alcohol and continue cycle. (Patient not taking: Reported on 09/29/2023) 6.6 mL 1   DENTA 5000 PLUS 1.1 % CREA dental cream Take by mouth at bedtime. (Patient not taking: Reported on 09/29/2023)     loratadine  (CLARITIN ) 10 MG tablet Take 1 tablet (10 mg total) by mouth daily. (Patient not taking: Reported on 09/29/2023) 30 tablet 11   pantoprazole  (PROTONIX ) 40 MG tablet Take 1 tablet (40 mg total) by mouth 2 (two) times daily. (Patient not taking: Reported on 09/29/2023) 30 tablet 11   atorvastatin  (LIPITOR) 10 MG tablet Take 1 tablet (10 mg total) by mouth daily. (Patient not taking: Reported on 09/29/2023) 90 tablet 3   No facility-administered medications prior to visit.    ROS: Review of Systems  Constitutional:  Negative for appetite change, fatigue and unexpected weight change.  HENT:  Negative for congestion, nosebleeds, sneezing, sore throat and trouble swallowing.   Eyes:  Negative for itching and visual disturbance.  Respiratory:  Negative for cough.   Cardiovascular:  Negative for chest pain, palpitations and leg swelling.  Gastrointestinal:  Negative for abdominal distention, blood in stool, diarrhea and nausea.  Genitourinary:  Negative for frequency and hematuria.  Musculoskeletal:  Negative for back pain, gait problem, joint swelling and neck pain.  Skin:  Negative for rash.  Neurological:  Negative for dizziness, tremors, speech  difficulty and weakness.  Psychiatric/Behavioral:  Negative for agitation, dysphoric mood, sleep disturbance and suicidal ideas. The patient is not nervous/anxious.     Objective:  BP 122/84   Pulse 73   Temp 98.2 F (36.8 C)   Ht 5\' 10"  (1.778 m)   Wt 213 lb 6.4 oz (96.8 kg)   SpO2 99%   BMI 30.62 kg/m   BP Readings from Last 3 Encounters:  09/29/23 122/84  02/11/23 131/84  09/21/22 130/78    Wt Readings from Last 3 Encounters:  09/29/23 213 lb 6.4 oz (96.8 kg)  02/11/23 215 lb (97.5 kg)  01/05/23 215 lb (97.5 kg)    Physical Exam Constitutional:      General: He is not in acute distress.    Appearance: Normal appearance. He is well-developed.     Comments: NAD  Eyes:     Conjunctiva/sclera: Conjunctivae normal.     Pupils: Pupils are equal, round, and reactive to light.  Neck:     Thyroid : No thyromegaly.     Vascular: No JVD.  Cardiovascular:     Rate and Rhythm: Normal rate and regular rhythm.     Heart sounds: Normal heart sounds. No murmur heard.    No friction rub. No gallop.  Pulmonary:     Effort: Pulmonary effort is normal. No respiratory distress.     Breath sounds: Normal breath sounds. No wheezing or rales.  Chest:     Chest wall: No tenderness.  Abdominal:     General: Bowel sounds are normal. There is no  distension.     Palpations: Abdomen is soft. There is no mass.     Tenderness: There is no abdominal tenderness. There is no guarding or rebound.  Musculoskeletal:        General: No tenderness. Normal range of motion.     Cervical back: Normal range of motion.     Right lower leg: No edema.     Left lower leg: No edema.  Lymphadenopathy:     Cervical: No cervical adenopathy.  Skin:    General: Skin is warm and dry.     Findings: No rash.  Neurological:     Mental Status: He is alert and oriented to person, place, and time.     Cranial Nerves: No cranial nerve deficit.     Motor: No abnormal muscle tone.     Coordination: Coordination  normal.     Gait: Gait normal.     Deep Tendon Reflexes: Reflexes are normal and symmetric.  Psychiatric:        Behavior: Behavior normal.        Thought Content: Thought content normal.        Judgment: Judgment normal.    Pt declined rectal RUQ rib w/pain   Lab Results  Component Value Date   WBC 6.8 09/28/2023   HGB 14.6 09/28/2023   HCT 44.4 09/28/2023   PLT 253.0 09/28/2023   GLUCOSE 101 (H) 09/28/2023   CHOL 319 (H) 09/28/2023   TRIG 120.0 09/28/2023   HDL 44.30 09/28/2023   LDLDIRECT 198.4 11/28/2012   LDLCALC 251 (H) 09/28/2023   ALT 18 09/28/2023   AST 17 09/28/2023   NA 140 09/28/2023   K 3.8 09/28/2023   CL 106 09/28/2023   CREATININE 0.84 09/28/2023   BUN 15 09/28/2023   CO2 24 09/28/2023   TSH 2.99 09/28/2023   PSA 1.99 09/28/2023   INR 1.1 (H) 04/28/2017   HGBA1C 5.5 06/02/2017    US  Abdomen Limited RUQ (LIVER/GB) Result Date: 12/22/2022 CLINICAL DATA:  Recurrent right upper quadrant abdomen pain for 2 months. EXAM: ULTRASOUND ABDOMEN LIMITED RIGHT UPPER QUADRANT COMPARISON:  February 21, 2009 FINDINGS: Gallbladder: No gallstones or wall thickening visualized. No sonographic Murphy sign noted by sonographer. Common bile duct: Diameter: 2.2 mm Liver: Mild diffuse increased echotexture. No focal lesion identified. Portal vein is patent on color Doppler imaging with normal direction of blood flow towards the liver. Other: None. IMPRESSION: 1. No acute abnormality identified. 2. Mild diffuse increased echotexture of the liver. This is a nonspecific finding but can be seen in fatty infiltration of the liver. Electronically Signed   By: Anna Barnes M.D.   On: 12/22/2022 10:04    Assessment & Plan:   Problem List Items Addressed This Visit     Well adult exam   Coronary calcium  CT score of 54 - 2020.  We discussed age appropriate health related issues, including available/recomended screening tests and vaccinations. Labs were ordered to be later reviewed .  All questions were answered. We discussed one or more of the following - seat belt use, use of sunscreen/sun exposure exercise, safe sex, fall risk reduction, second hand smoke exposure, firearm use and storage, seat belt use, a need for adhering to healthy diet and exercise. Labs were ordered.  All questions were answered.      Costochondritis - Primary   RUQ area RUQ pain since 2023 off and on. Not better off Lipitor Ice/heat Blue-Emu cream was recommended to use 2-3 times a day Ibuprofen  prn          Meds ordered this encounter  Medications   atorvastatin  (LIPITOR) 10 MG tablet    Sig: Take 1 tablet (10 mg total) by mouth daily.    Dispense:  90 tablet    Refill:  3      Follow-up: Return in about 1 year (around 09/28/2024) for Wellness Exam.  Anitra Barn, MD

## 2023-11-01 ENCOUNTER — Emergency Department (HOSPITAL_BASED_OUTPATIENT_CLINIC_OR_DEPARTMENT_OTHER)
Admission: EM | Admit: 2023-11-01 | Discharge: 2023-11-02 | Disposition: A | Discharge status: Home / Self Care | Attending: Emergency Medicine | Admitting: Emergency Medicine

## 2023-11-01 ENCOUNTER — Other Ambulatory Visit: Payer: Self-pay

## 2023-11-01 DIAGNOSIS — L03116 Cellulitis of left lower limb: Secondary | ICD-10-CM | POA: Insufficient documentation

## 2023-11-01 LAB — CBC WITH DIFFERENTIAL/PLATELET
Abs Immature Granulocytes: 0.05 10*3/uL (ref 0.00–0.07)
Basophils Absolute: 0.1 10*3/uL (ref 0.0–0.1)
Basophils Relative: 0 %
Eosinophils Absolute: 0.2 10*3/uL (ref 0.0–0.5)
Eosinophils Relative: 2 %
HCT: 41 % (ref 39.0–52.0)
Hemoglobin: 13.8 g/dL (ref 13.0–17.0)
Immature Granulocytes: 0 %
Lymphocytes Relative: 24 %
Lymphs Abs: 2.8 10*3/uL (ref 0.7–4.0)
MCH: 28.3 pg (ref 26.0–34.0)
MCHC: 33.7 g/dL (ref 30.0–36.0)
MCV: 84.2 fL (ref 80.0–100.0)
Monocytes Absolute: 1.1 10*3/uL — ABNORMAL HIGH (ref 0.1–1.0)
Monocytes Relative: 10 %
Neutro Abs: 7.4 10*3/uL (ref 1.7–7.7)
Neutrophils Relative %: 64 %
Platelets: 261 10*3/uL (ref 150–400)
RBC: 4.87 MIL/uL (ref 4.22–5.81)
RDW: 12.8 % (ref 11.5–15.5)
WBC: 11.6 10*3/uL — ABNORMAL HIGH (ref 4.0–10.5)
nRBC: 0 % (ref 0.0–0.2)

## 2023-11-01 NOTE — ED Triage Notes (Signed)
 Pt POV reporting L knee pain and swelling since Saturday, also reporting fever, concerned for possible infection. Took ibuprofen today with no improvement.

## 2023-11-02 ENCOUNTER — Other Ambulatory Visit (HOSPITAL_BASED_OUTPATIENT_CLINIC_OR_DEPARTMENT_OTHER): Admitting: Radiology

## 2023-11-02 ENCOUNTER — Emergency Department (HOSPITAL_BASED_OUTPATIENT_CLINIC_OR_DEPARTMENT_OTHER)

## 2023-11-02 LAB — COMPREHENSIVE METABOLIC PANEL WITH GFR
ALT: 15 U/L (ref 0–44)
AST: 21 U/L (ref 15–41)
Albumin: 4.2 g/dL (ref 3.5–5.0)
Alkaline Phosphatase: 58 U/L (ref 38–126)
Anion gap: 14 (ref 5–15)
BUN: 16 mg/dL (ref 6–20)
CO2: 20 mmol/L — ABNORMAL LOW (ref 22–32)
Calcium: 8.8 mg/dL — ABNORMAL LOW (ref 8.9–10.3)
Chloride: 104 mmol/L (ref 98–111)
Creatinine, Ser: 0.93 mg/dL (ref 0.61–1.24)
GFR, Estimated: >60 mL/min (ref >60)
Glucose, Bld: 140 mg/dL — ABNORMAL HIGH (ref 70–99)
Potassium: 3.6 mmol/L (ref 3.5–5.1)
Sodium: 138 mmol/L (ref 135–145)
Total Bilirubin: 0.5 mg/dL (ref 0.0–1.2)
Total Protein: 7.4 g/dL (ref 6.5–8.1)

## 2023-11-02 LAB — LACTIC ACID, PLASMA: Lactic Acid, Venous: 1.1 mmol/L (ref 0.5–1.9)

## 2023-11-02 LAB — SEDIMENTATION RATE: Sed Rate: 15 mm/h (ref 0–16)

## 2023-11-02 LAB — C-REACTIVE PROTEIN: CRP: 7.3 mg/dL — ABNORMAL HIGH (ref <1.0)

## 2023-11-02 MED ORDER — CEPHALEXIN 500 MG PO CAPS: 500.0000 mg | ORAL_CAPSULE | Freq: Four times a day (QID) | ORAL | 0 refills | Status: DC

## 2023-11-02 MED ORDER — DOXYCYCLINE HYCLATE 100 MG PO CAPS: 100.0000 mg | ORAL_CAPSULE | Freq: Two times a day (BID) | ORAL | 0 refills | Status: DC

## 2023-11-02 MED ORDER — SODIUM CHLORIDE 0.9 % IV SOLN
2.0000 g | Freq: Once | INTRAVENOUS | Status: AC
  Administered 2023-11-02: 2 g via INTRAVENOUS
  Filled 2023-11-02: qty 20

## 2023-11-02 NOTE — ED Provider Notes (Addendum)
 [place] EMERGENCY DEPARTMENT AT [facility] Provider Note   CSN: [id number] Arrival date & time: 11/01/23  2330     History  No chief complaint on file.   Adrian Cruz is a 58 y.o. male.  Patient presents to the emergency department for evaluation of pain, swelling, redness of left knee.  He reports that he did a lot of work kneeling prior to onset of symptoms.  He has been experiencing a fever at home, has taken ibuprofen.       Home Medications Prior to Admission medications   Medication Sig Start Date End Date Taking? Authorizing Provider  cephALEXin (KEFLEX) 500 MG capsule Take 1 capsule (500 mg total) by mouth 4 (four) times daily. 11/02/23  Yes Lenon Kuennen, Marine Sia, MD  doxycycline  (VIBRAMYCIN ) 100 MG capsule Take 1 capsule (100 mg total) by mouth 2 (two) times daily. 11/02/23  Yes Lewis Keats, Marine Sia, MD  atorvastatin  (LIPITOR) 10 MG tablet Take 1 tablet (10 mg total) by mouth daily. 09/29/23   Plotnikov, Aleksei V, MD  Cholecalciferol (VITAMIN D3) 1000 UNITS CAPS Take by mouth daily.    [provider]  ciclopirox  (PENLAC ) 8 % solution Apply topically at bedtime. Apply over nail and surrounding skin. Apply daily over previous coat. After seven (7) days, may remove with alcohol and continue cycle. Patient not taking: Reported on 09/29/2023 01/14/21   Plotnikov, Aleksei V, MD  DENTA 5000 PLUS 1.1 % CREA dental cream Take by mouth at bedtime. Patient not taking: Reported on 09/29/2023 06/28/22   [provider]  loratadine  (CLARITIN ) 10 MG tablet Take 1 tablet (10 mg total) by mouth daily. Patient not taking: Reported on 09/29/2023 05/24/17   Plotnikov, Oakley Bellman, MD  pantoprazole  (PROTONIX ) 40 MG tablet Take 1 tablet (40 mg total) by mouth 2 (two) times daily. Patient not taking: Reported on 09/29/2023 09/21/22   Plotnikov, Oakley Bellman, MD      Allergies    Aspirin , Pravastatin , and Simvastatin    Review of Systems   Review of Systems  Physical  Exam Updated Vital Signs BP (!) 135/91   Pulse 84   Temp 99.4 F (37.4 C) (Oral)   Resp 17   Ht 5\' 11"  (1.803 m)   Wt 97.5 kg   SpO2 97%   BMI 29.99 kg/m  Physical Exam Vitals and nursing note reviewed.  Constitutional:      General: He is not in acute distress.    Appearance: He is well-developed.  HENT:     Head: Normocephalic and atraumatic.     Mouth/Throat:     Mouth: Mucous membranes are moist.  Eyes:     General: Vision grossly intact. Gaze aligned appropriately.     Extraocular Movements: Extraocular movements intact.     Conjunctiva/sclera: Conjunctivae normal.  Cardiovascular:     Rate and Rhythm: Normal rate and regular rhythm.     Pulses: Normal pulses.     Heart sounds: Normal heart sounds, S1 normal and S2 normal. No murmur heard.    No friction rub. No gallop.  Pulmonary:     Effort: Pulmonary effort is normal. No respiratory distress.     Breath sounds: Normal breath sounds.  Abdominal:     Palpations: Abdomen is soft.     Tenderness: There is no abdominal tenderness. There is no guarding or rebound.     Hernia: No hernia is present.  Musculoskeletal:        General: No swelling.     Cervical  back: Full passive range of motion without pain, normal range of motion and neck supple. No pain with movement, spinous process tenderness or muscular tenderness. Normal range of motion.     Left knee: Swelling (Prepatellar region) present. No ecchymosis or lacerations. Normal range of motion (Full range of motion without pain). Tenderness (Prepatellar region) present.     Right lower leg: No edema.     Left lower leg: No edema.  Skin:    General: Skin is warm and dry.     Capillary Refill: Capillary refill takes less than 2 seconds.     Findings: Erythema (Left prepatellar area) present. No ecchymosis, lesion or wound.  Neurological:     Mental Status: He is alert and oriented to person, place, and time.     GCS: GCS eye subscore is 4. GCS verbal subscore is 5.  GCS motor subscore is 6.     Cranial Nerves: Cranial nerves 2-12 are intact.     Sensory: Sensation is intact.     Motor: Motor function is intact. No weakness or abnormal muscle tone.     Coordination: Coordination is intact.  Psychiatric:        Mood and Affect: Mood normal.        Speech: Speech normal.        Behavior: Behavior normal.        ED Results / Procedures / Treatments   Labs (all labs ordered are listed, but only abnormal results are displayed) Labs Reviewed  COMPREHENSIVE METABOLIC PANEL WITH GFR - Abnormal; Notable for the following components:      Result Value   CO2 20 (*)    Glucose, Bld 140 (*)    Calcium  8.8 (*)    All other components within normal limits  CBC WITH DIFFERENTIAL/PLATELET - Abnormal; Notable for the following components:   WBC 11.6 (*)    Monocytes Absolute 1.1 (*)    All other components within normal limits  CULTURE, BLOOD (ROUTINE X 2)  CULTURE, BLOOD (ROUTINE X 2)  LACTIC ACID, PLASMA  SEDIMENTATION RATE  C-REACTIVE PROTEIN    EKG None  Radiology DG Knee Complete 4 Views Left Result Date: 11/02/2023 CLINICAL DATA:  Left knee pain EXAM: LEFT KNEE - COMPLETE 4+ VIEW COMPARISON:  None available FINDINGS: Anterior soft tissue swelling. No acute bony abnormality. Specifically, no fracture, subluxation, or dislocation. No joint effusion. Joint spaces maintained. IMPRESSION: Anterior soft tissue swelling.  No acute bony abnormality. Electronically Signed   By: Janeece Mechanic M.D.   On: 11/02/2023 00:20    Procedures Procedures    Medications Ordered in ED Medications  cefTRIAXone (ROCEPHIN) 2 g in sodium chloride  0.9 % 100 mL IVPB (has no administration in time range)    ED Course/ Medical Decision Making/ A&P                                 Medical Decision Making Amount and/or Complexity of Data Reviewed Labs: ordered. Radiology: ordered.  Risk Prescription drug management.   Differential diagnosis considered  includes, but not limited to: Cellulitis; prepatellar bursitis; septic arthritis  Patient presents with complaints of pain, swelling, redness of the left knee.  He reports that this started after he worked for an extended period of time kneeling.  There is some swelling in the soft tissues in the prepatellar area but no palpable joint effusion.  Patient has completely normal range of motion without  pain.  X-ray unremarkable, no joint effusion or signs of infection.  Presentation is not consistent with septic arthritis based on his range of motion.  He has no risk factors for septic arthritis.  There is a fair amount of erythema overlying the anterior, medial and lateral portion of the knee.  I also do not palpate an effusion.  Trying arthrocentesis would be detrimental.  Based on workup, this is most consistent with a cellulitis or possible early bursitis that can be treated with antibiotics and close follow-up.        Final Clinical Impression(s) / ED Diagnoses Final diagnoses:  Cellulitis of left lower extremity    Rx / DC Orders ED Discharge Orders          Ordered    cephALEXin (KEFLEX) 500 MG capsule  4 times daily        11/02/23 0141    doxycycline  (VIBRAMYCIN ) 100 MG capsule  2 times daily        11/02/23 0141              Ballard Bongo, MD 11/02/23 0141    Ballard Bongo, MD 11/02/23 [phone]

## 2023-11-03 ENCOUNTER — Telehealth (HOSPITAL_BASED_OUTPATIENT_CLINIC_OR_DEPARTMENT_OTHER): Payer: Self-pay | Admitting: Emergency Medicine

## 2023-11-03 LAB — BLOOD CULTURE ID PANEL (REFLEXED) - BCID2

## 2023-11-03 NOTE — Telephone Encounter (Signed)
 Patient called back.  States he is doing well.  No fevers or chills.  Has full range of motion of his knee.  Says he is even able to run.  Says that the swelling has not gone down significantly.  Has been compliant with his antibiotics. Discussed with infectious disease who feels that he should come back for admission. This was communicated to the patient.

## 2023-11-03 NOTE — Telephone Encounter (Signed)
 Called patient to discuss positive blood culture results.  Unfortunately was unable to reach the patient.  Left a compliant voicemail with instructions to have him call us  back

## 2023-11-04 LAB — CULTURE, BLOOD (ROUTINE X 2): Special Requests: ADEQUATE

## 2023-11-05 ENCOUNTER — Ambulatory Visit (INDEPENDENT_AMBULATORY_CARE_PROVIDER_SITE_OTHER): Admitting: Internal Medicine

## 2023-11-05 ENCOUNTER — Encounter: Payer: Self-pay | Admitting: Internal Medicine

## 2023-11-05 VITALS — BP 118/70 | HR 75 | Temp 98.1°F | Ht 71.0 in | Wt 213.0 lb

## 2023-11-05 DIAGNOSIS — R7881 Bacteremia: Secondary | ICD-10-CM | POA: Insufficient documentation

## 2023-11-05 DIAGNOSIS — B955 Unspecified streptococcus as the cause of diseases classified elsewhere: Secondary | ICD-10-CM | POA: Diagnosis not present

## 2023-11-05 DIAGNOSIS — L03116 Cellulitis of left lower limb: Secondary | ICD-10-CM | POA: Diagnosis not present

## 2023-11-05 NOTE — Assessment & Plan Note (Addendum)
 Finish Keflex  and doxycycline  His left leg shows slight infiltration of the skin in the distal anterior thigh and over the proximal part of the kneecap.  Full range of motion of the joint no effusion.  No signs of bursitis Both knees with rough hyperpigmented skin due to frequent kneeling. Use knee pads/soft mat for kneeling.  Wash kneepads and wear pants

## 2023-11-05 NOTE — Progress Notes (Signed)
 Subjective:  Patient ID: Adrian Cruz, male    DOB: [birth year]  Age: 58 y.o. MRN: [id number]  CC: Medical Management of Chronic Issues (Pt was informed in the ED that he has a blood infection and is needing to f/u with PCP to check blood levels and heart to ensure the infection has not cause any heart problems. Pt states he is still have the left knee pain and swelling.)   HPI Lemon Sternberg presents for strep bacteremia, L knee cellulitis on 11/01/23  Per Dr Carol Chroman:  Patient presents with complaints of pain, swelling, redness of the left knee.  He reports that this started after he worked for an extended period of time kneeling.  There is some swelling in the soft tissues in the prepatellar area but no palpable joint effusion.  Patient has completely normal range of motion without pain.  X-ray unremarkable, no joint effusion or signs of infection.  Presentation is not consistent with septic arthritis based on his range of motion.  He has no risk factors for septic arthritis.  There is a fair amount of erythema overlying the anterior, medial and lateral portion of the knee.  I also do not palpate an effusion.  Trying arthrocentesis would be detrimental.  Based on workup, this is most consistent with a cellulitis or possible early bursitis that can be treated with antibiotics and close follow-up.  The patient was discharged home on doxycycline  and Keflex  Await your cultures grew out strep.  He is feeling back to normal himself.  No chills, fever, night sweats             Outpatient Medications Prior to Visit  Medication Sig Dispense Refill   atorvastatin  (LIPITOR) 10 MG tablet Take 1 tablet (10 mg total) by mouth daily. 90 tablet 3   cephALEXin  (KEFLEX ) 500 MG capsule Take 1 capsule (500 mg total) by mouth 4 (four) times daily. 40 capsule 0   Cholecalciferol (VITAMIN D3) 1000 UNITS CAPS Take by mouth daily.     doxycycline  (VIBRAMYCIN ) 100 MG capsule Take 1 capsule (100 mg  total) by mouth 2 (two) times daily. 20 capsule 0   loratadine  (CLARITIN ) 10 MG tablet Take 1 tablet (10 mg total) by mouth daily. 30 tablet 11   ciclopirox  (PENLAC ) 8 % solution Apply topically at bedtime. Apply over nail and surrounding skin. Apply daily over previous coat. After seven (7) days, may remove with alcohol and continue cycle. (Patient not taking: Reported on 11/05/2023) 6.6 mL 1   DENTA 5000 PLUS 1.1 % CREA dental cream Take by mouth at bedtime. (Patient not taking: Reported on 11/05/2023)     pantoprazole  (PROTONIX ) 40 MG tablet Take 1 tablet (40 mg total) by mouth 2 (two) times daily. (Patient not taking: Reported on 11/05/2023) 30 tablet 11   No facility-administered medications prior to visit.    ROS: Review of Systems  Constitutional:  Negative for appetite change, chills, diaphoresis, fatigue, fever and unexpected weight change.  HENT:  Negative for congestion, nosebleeds, sneezing, sore throat and trouble swallowing.   Eyes:  Negative for itching and visual disturbance.  Respiratory:  Negative for cough, chest tightness, shortness of breath and wheezing.   Cardiovascular:  Negative for chest pain, palpitations and leg swelling.  Gastrointestinal:  Negative for abdominal distention, blood in stool, diarrhea and nausea.  Genitourinary:  Negative for frequency and hematuria.  Musculoskeletal:  Negative for back pain, gait problem, joint swelling and neck pain.  Skin:  Positive for color change. Negative  for rash.  Neurological:  Negative for dizziness, tremors, speech difficulty and weakness.  Hematological:  Does not bruise/bleed easily.  Psychiatric/Behavioral:  Negative for agitation, dysphoric mood and sleep disturbance. The patient is not nervous/anxious.     Objective:  BP 118/70   Pulse 75   Temp 98.1 F (36.7 C) (Oral)   Ht 5' 11 (1.803 m)   Wt 213 lb (96.6 kg)   SpO2 95%   BMI 29.71 kg/m   BP Readings from Last 3 Encounters:  11/05/23 118/70  11/02/23  119/81  09/29/23 122/84    Wt Readings from Last 3 Encounters:  11/05/23 213 lb (96.6 kg)  11/01/23 215 lb (97.5 kg)  09/29/23 213 lb 6.4 oz (96.8 kg)    Physical Exam Constitutional:      General: He is not in acute distress.    Appearance: Normal appearance. He is well-developed. He is not ill-appearing, toxic-appearing or diaphoretic.     Comments: NAD   Eyes:     Conjunctiva/sclera: Conjunctivae normal.     Pupils: Pupils are equal, round, and reactive to light.   Neck:     Thyroid : No thyromegaly.     Vascular: No JVD.   Cardiovascular:     Rate and Rhythm: Normal rate and regular rhythm.     Heart sounds: Normal heart sounds. No murmur heard.    No friction rub. No gallop.  Pulmonary:     Effort: Pulmonary effort is normal. No respiratory distress.     Breath sounds: Normal breath sounds. No wheezing or rales.  Chest:     Chest wall: No tenderness.  Abdominal:     General: Bowel sounds are normal. There is no distension.     Palpations: Abdomen is soft. There is no mass.     Tenderness: There is no abdominal tenderness. There is no guarding or rebound.   Musculoskeletal:        General: Swelling present. No tenderness. Normal range of motion.     Cervical back: Normal range of motion.     Right lower leg: No edema.     Left lower leg: No edema.  Lymphadenopathy:     Cervical: No cervical adenopathy.   Skin:    General: Skin is warm and dry.     Findings: No bruising, erythema, lesion or rash.   Neurological:     Mental Status: He is alert and oriented to person, place, and time.     Cranial Nerves: No cranial nerve deficit.     Motor: No abnormal muscle tone.     Coordination: Coordination normal.     Gait: Gait normal.     Deep Tendon Reflexes: Reflexes are normal and symmetric.   Psychiatric:        Behavior: Behavior normal.        Thought Content: Thought content normal.        Judgment: Judgment normal.   His left leg shows slight infiltration  of the skin in the distal anterior thigh and over the proximal part of the kneecap.  Full range of motion of the joint no effusion.  No signs of bursitis Both knees with rough hyperpigmented skin due to frequent kneeling.  Lab Results  Component Value Date   WBC 11.6 (H) 11/01/2023   HGB 13.8 11/01/2023   HCT 41.0 11/01/2023   PLT 261 11/01/2023   GLUCOSE 140 (H) 11/01/2023   CHOL 319 (H) 09/28/2023   TRIG 120.0 09/28/2023   HDL 44.30 09/28/2023  LDLDIRECT 198.4 11/28/2012   LDLCALC 251 (H) 09/28/2023   ALT 15 11/01/2023   AST 21 11/01/2023   NA 138 11/01/2023   K 3.6 11/01/2023   CL 104 11/01/2023   CREATININE 0.93 11/01/2023   BUN 16 11/01/2023   CO2 20 (L) 11/01/2023   TSH 2.99 09/28/2023   PSA 1.99 09/28/2023   INR 1.1 (H) 04/28/2017   HGBA1C 5.5 06/02/2017    DG Knee Complete 4 Views Left Result Date: 11/02/2023 CLINICAL DATA:  Left knee pain EXAM: LEFT KNEE - COMPLETE 4+ VIEW COMPARISON:  None available FINDINGS: Anterior soft tissue swelling. No acute bony abnormality. Specifically, no fracture, subluxation, or dislocation. No joint effusion. Joint spaces maintained. IMPRESSION: Anterior soft tissue swelling.  No acute bony abnormality. Electronically Signed   By: Janeece Mechanic M.D.   On: 11/02/2023 00:20    Assessment & Plan:   Problem List Items Addressed This Visit     Cellulitis of left lower extremity   Finish Keflex  and doxycycline  His left leg shows slight infiltration of the skin in the distal anterior thigh and over the proximal part of the kneecap.  Full range of motion of the joint no effusion.  No signs of bursitis Both knees with rough hyperpigmented skin due to frequent kneeling. Use knee pads/soft mat for kneeling.  Wash kneepads and wear pants      Relevant Orders   ECHOCARDIOGRAM COMPLETE   C-reactive protein   Blood culture (routine single)   Sedimentation rate   Comprehensive metabolic panel with GFR   CBC with Differential/Platelet    Bacteremia due to Streptococcus - Primary   Likely secondary to left leg cellulitis.  Doubt septic endocarditis etc. Finish Keflex  and doxycycline .  Then, repeat blood work, obtain another culture. Cardiac echocardiogram was ordered. His left leg shows slight infiltration of the skin in the distal anterior thigh and over the proximal part of the kneecap.  Full range of motion of the joint no effusion.  No signs of bursitis Both knees with rough hyperpigmented skin due to frequent kneeling. Use knee pads/soft mat for kneeling.  Wash kneepads and work pants      Relevant Orders   ECHOCARDIOGRAM COMPLETE   C-reactive protein   Blood culture (routine single)   Sedimentation rate   Comprehensive metabolic panel with GFR   CBC with Differential/Platelet      No orders of the defined types were placed in this encounter.     Follow-up: Return in about 6 weeks (around 12/17/2023) for a follow-up visit.  Anitra Barn, MD

## 2023-11-05 NOTE — Assessment & Plan Note (Addendum)
 Likely secondary to left leg cellulitis.  Doubt septic endocarditis etc. Finish Keflex  and doxycycline .  Then, repeat blood work, obtain another culture. Cardiac echocardiogram was ordered. His left leg shows slight infiltration of the skin in the distal anterior thigh and over the proximal part of the kneecap.  Full range of motion of the joint no effusion.  No signs of bursitis Both knees with rough hyperpigmented skin due to frequent kneeling. Use knee pads/soft mat for kneeling.  Wash kneepads and work pants

## 2023-11-06 ENCOUNTER — Telehealth (HOSPITAL_BASED_OUTPATIENT_CLINIC_OR_DEPARTMENT_OTHER): Payer: Self-pay | Admitting: *Deleted

## 2023-11-06 NOTE — Telephone Encounter (Signed)
 Post ED Visit - Positive Culture Follow-up  Culture report reviewed by antimicrobial stewardship pharmacist: [facility] []  Court Distance, Pharm.D. []  Skeet Duke, Pharm.D., BCPS AQ-ID []  Leslee Rase, Pharm.D., BCPS []  Garland Junk, [address].D., BCPS []  [facility], [address].D., BCPS, AAHIVP []  Alcide Aly, Pharm.D., BCPS, AAHIVP []  Jerri Morale, PharmD, BCPS []  Graham Laws, PharmD, BCPS []  Cleda Curly, PharmD, BCPS []  Tamar Fairly, PharmD []  Ballard Levels, PharmD, BCPS [x]  Trinidad Funk, PharmD  [facility] []  Arlyne Bering, PharmD []  Sherryle Don, PharmD []  Van Gelinas, PharmD []  Delila Felty, Rph []  [place]) [name], PharmD []  Augustina Block, PharmD []  Arie Kurtz, PharmD []  Sharlyn Deaner, PharmD []  Agnes Hose, PharmD []  Kendall Pauls, PharmD []  Gladstone Lamer, PharmD []  Armanda Bern, PharmD []  Tera Fellows, PharmD   Positive blood culture 6/11 MD called and discussed results with pt.  ID rec pt to return.  PT followed up with PCP who is managing outpatient and repeat Blood cultures  Adrian Cruz 11/06/2023, 10:36 AM

## 2023-11-07 LAB — CULTURE, BLOOD (ROUTINE X 2)
Culture: NO GROWTH
Special Requests: ADEQUATE

## 2023-11-09 ENCOUNTER — Ambulatory Visit: Payer: Self-pay | Admitting: Internal Medicine

## 2023-11-09 ENCOUNTER — Ambulatory Visit (HOSPITAL_COMMUNITY)
Admission: RE | Admit: 2023-11-09 | Discharge: 2023-11-09 | Disposition: A | Discharge status: Home / Self Care | Source: Ambulatory Visit | Attending: Internal Medicine | Admitting: Internal Medicine

## 2023-11-09 ENCOUNTER — Ambulatory Visit: Admitting: Cardiovascular Disease

## 2023-11-09 DIAGNOSIS — L03116 Cellulitis of left lower limb: Secondary | ICD-10-CM | POA: Insufficient documentation

## 2023-11-09 DIAGNOSIS — R7881 Bacteremia: Secondary | ICD-10-CM | POA: Diagnosis not present

## 2023-11-09 DIAGNOSIS — R931 Abnormal findings on diagnostic imaging of heart and coronary circulation: Secondary | ICD-10-CM

## 2023-11-09 DIAGNOSIS — B955 Unspecified streptococcus as the cause of diseases classified elsewhere: Secondary | ICD-10-CM | POA: Diagnosis present

## 2023-11-09 LAB — ECHOCARDIOGRAM COMPLETE
AR max vel: 1.65 cm2
AV Area VTI: 1.73 cm2
AV Area mean vel: 1.71 cm2
AV Mean grad: 8 mmHg
AV Peak grad: 14 mmHg
Ao pk vel: 1.87 m/s
Area-P 1/2: 3.91 cm2
MV M vel: 2.73 m/s
MV Peak grad: 29.8 mmHg
P 1/2 time: 426 ms
S' Lateral: 2.77 cm

## 2023-11-10 ENCOUNTER — Other Ambulatory Visit: Payer: Self-pay

## 2023-11-10 ENCOUNTER — Other Ambulatory Visit: Payer: Self-pay | Admitting: Internal Medicine

## 2023-11-10 DIAGNOSIS — R931 Abnormal findings on diagnostic imaging of heart and coronary circulation: Secondary | ICD-10-CM

## 2023-11-10 DIAGNOSIS — E785 Hyperlipidemia, unspecified: Secondary | ICD-10-CM | POA: Insufficient documentation

## 2023-11-10 DIAGNOSIS — R7881 Bacteremia: Secondary | ICD-10-CM

## 2023-11-11 ENCOUNTER — Ambulatory Visit

## 2023-11-11 ENCOUNTER — Other Ambulatory Visit (INDEPENDENT_AMBULATORY_CARE_PROVIDER_SITE_OTHER)

## 2023-11-11 VITALS — BP 136/84 | HR 71 | Ht 71.0 in | Wt 217.2 lb

## 2023-11-11 DIAGNOSIS — R7881 Bacteremia: Secondary | ICD-10-CM

## 2023-11-11 DIAGNOSIS — E785 Hyperlipidemia, unspecified: Secondary | ICD-10-CM | POA: Diagnosis not present

## 2023-11-11 DIAGNOSIS — B955 Unspecified streptococcus as the cause of diseases classified elsewhere: Secondary | ICD-10-CM

## 2023-11-11 DIAGNOSIS — I330 Acute and subacute infective endocarditis: Secondary | ICD-10-CM | POA: Insufficient documentation

## 2023-11-11 DIAGNOSIS — L03116 Cellulitis of left lower limb: Secondary | ICD-10-CM

## 2023-11-11 LAB — CBC WITH DIFFERENTIAL/PLATELET
Basophils Absolute: 0 10*3/uL (ref 0.0–0.1)
Basophils Relative: 0.5 % (ref 0.0–3.0)
Eosinophils Absolute: 0.1 10*3/uL (ref 0.0–0.7)
Eosinophils Relative: 1.5 % (ref 0.0–5.0)
HCT: 41.3 % (ref 39.0–52.0)
Hemoglobin: 13.6 g/dL (ref 13.0–17.0)
Lymphocytes Relative: 35.6 % (ref 12.0–46.0)
Lymphs Abs: 3.1 10*3/uL (ref 0.7–4.0)
MCHC: 33 g/dL (ref 30.0–36.0)
MCV: 84.2 fl (ref 78.0–100.0)
Monocytes Absolute: 0.8 10*3/uL (ref 0.1–1.0)
Monocytes Relative: 8.8 % (ref 3.0–12.0)
Neutro Abs: 4.6 10*3/uL (ref 1.4–7.7)
Neutrophils Relative %: 53.6 % (ref 43.0–77.0)
Platelets: 353 10*3/uL (ref 150.0–400.0)
RBC: 4.91 Mil/uL (ref 4.22–5.81)
RDW: 13.6 % (ref 11.5–15.5)
WBC: 8.6 10*3/uL (ref 4.0–10.5)

## 2023-11-11 LAB — COMPREHENSIVE METABOLIC PANEL WITH GFR
ALT: 19 U/L (ref 0–53)
AST: 18 U/L (ref 0–37)
Albumin: 4.1 g/dL (ref 3.5–5.2)
Alkaline Phosphatase: 49 U/L (ref 39–117)
BUN: 19 mg/dL (ref 6–23)
CO2: 27 meq/L (ref 19–32)
Calcium: 9.1 mg/dL (ref 8.4–10.5)
Chloride: 105 meq/L (ref 96–112)
Creatinine, Ser: 0.78 mg/dL (ref 0.40–1.50)
GFR: 97.12 mL/min (ref >60.00)
Glucose, Bld: 95 mg/dL (ref 70–99)
Potassium: 3.9 meq/L (ref 3.5–5.1)
Sodium: 137 meq/L (ref 135–145)
Total Bilirubin: 0.4 mg/dL (ref 0.2–1.2)
Total Protein: 7.5 g/dL (ref 6.0–8.3)

## 2023-11-11 LAB — C-REACTIVE PROTEIN: CRP: <1 mg/dL (ref 0.5–20.0)

## 2023-11-11 LAB — SEDIMENTATION RATE: Sed Rate: 23 mm/h — ABNORMAL HIGH (ref 0–20)

## 2023-11-11 NOTE — Patient Instructions (Signed)
 Medication Instructions:  Your physician recommends that you continue on your current medications as directed. Please refer to the Current Medication list given to you today.  *If you need a refill on your cardiac medications before your next appointment, please call your pharmacy*  Lab Work: None If you have labs (blood work) drawn today and your tests are completely normal, you will receive your results only by: MyChart Message (if you have MyChart) OR A paper copy in the mail If you have any lab test that is abnormal or we need to change your treatment, we will call you to review the results.  Testing/Procedures:    Dear Adrian Cruz  You are scheduled for a TEE (Transesophageal Echocardiogram) on Monday, June 23 with Dr. Audery Blazing.  Please arrive at the [facility] (Main Entrance A) at [facility]: [address] [place], [place] [address] at 7:30 AM (This time is 1.5 hour(s) before your procedure to ensure your preparation).   Free valet parking service is available. You will check in at ADMITTING.   *Please Note: You will receive a call the day before your procedure to confirm the appointment time. That time may have changed from the original time based on the schedule for that day.*   DIET:  Nothing to eat or drink after midnight except a sip of water with medications (see medication instructions below)  MEDICATION INSTRUCTIONS: !!IF ANY NEW MEDICATIONS ARE STARTED AFTER TODAY, PLEASE NOTIFY YOUR PROVIDER AS SOON AS POSSIBLE!!  FYI: Medications such as Semaglutide (Ozempic, [place]), Tirzepatide (Mounjaro, Zepbound), Dulaglutide (Trulicity), etc (GLP1 agonists) AND Canagliflozin (Invokana), Dapagliflozin (Farxiga), Empagliflozin (Jardiance), Ertugliflozin (Steglatro), Bexagliflozin [employer]) or any combination with one of these drugs such as Invokamet (Canagliflozin/Metformin), Synjardy (Empagliflozin/Metformin), etc (SGLT2 inhibitors) must be held around the time  of a procedure. This is not a comprehensive list of all of these drugs. Please review all of your medications and talk to your provider if you take any one of these. If you are not sure, ask your provider.   LABS: Patient's CMP and CBC were drawn on 11/05/23.   FYI:  For your safety, and to allow us  to monitor your vital signs accurately during the surgery/procedure we request: If you have artificial nails, gel coating, SNS etc, please have those removed prior to your surgery/procedure. Not having the nail coverings /polish removed may result in cancellation or delay of your surgery/procedure.  Your support person will be asked to wait in the waiting room during your procedure.  It is OK to have someone drop you off and come back when you are ready to be discharged.  You cannot drive after the procedure and will need someone to drive you home.  Bring your insurance cards.  *Special Note: Every effort is made to have your procedure done on time. Occasionally there are emergencies that occur at the hospital that may cause delays. Please be patient if a delay does occur.      Follow-Up: At [facility], you and your health needs are our priority.  As part of our continuing mission to provide you with exceptional heart care, our providers are all part of one team.  This team includes your primary Cardiologist (physician) and Advanced Practice Providers or APPs (Physician Assistants and Nurse Practitioners) who all work together to provide you with the care you need, when you need it.  Your next appointment:   Follow up will be determined based on the test results.  Provider:   Tereasa Felty  Madireddy, MD    We recommend signing up for the patient portal called MyChart.  Sign up information is provided on this After Visit Summary.  MyChart is used to connect with patients for Virtual Visits (Telemedicine).  Patients are able to view lab/test results, encounter notes, upcoming appointments,  etc.  Non-urgent messages can be sent to your provider as well.   To learn more about what you can do with MyChart, go to [email or web address].   Other Instructions None

## 2023-11-11 NOTE — Progress Notes (Unsigned)
 Cardiology Consultation:    Date:  11/12/2023   ID:  Adrian Cruz, DOB [birth year], MRN [id number]  PCP:  Genia Kettering, MD  Cardiologist:  Daymon Evans Amron Guerrette, MD   Referring MD: Genia Kettering, MD   No chief complaint on file.    ASSESSMENT AND PLAN:   Mr Adrian Cruz 58 year old male patient with no significant cardiac health history and calcium  score of 54 from October 2020 after recent left knee related symptoms associated with redness and swelling after working extended time while kneeling down had an ER visit, started treatment for cellulitis and blood cultures from the ER visit 1 set was positive for Streptococcus species on 11/02/2023 and has continued on doxycycline  and cephalexin , with improving symptoms, subsequent echocardiogram showing normal biventricular structure and function with mild anemia mobile echodensity associated with aortic valve without any major dysfunction.   Problem List Items Addressed This Visit     Bacteremia   Currently on antibiotics with Keflex  and doxycycline .  Being managed by PCP. Repeat blood cultures pending, recommended he proceed with this right away and we will place an ID consult to review this further.      Relevant Orders   Ambulatory referral to Infectious Disease   Aortic valve vegetation - Primary   Aortic valve finding on transthoracic echocardiogram thickening mobile echodensity associated with noncoronary and left coronary cusp. No significant valve dysfunction on echocardiogram 11/09/2023.  In the context of recent cellulitis of the left knee and bacteremia with Streptococcus species on blood culture 11/02/2023, he is currently on oral antibiotics with cephalexin  and doxycycline .  Has no significant symptoms of fever but does report type B symptoms such as sweating and easy fatigability and night sweats.  Repeat blood cultures pending, advised him to get these done today.  Will obtain transesophageal  echocardiogram for better visualization of the aortic valve, will try to expedite this at [facility].        Relevant Orders   EKG 12-Lead (Completed)   Ambulatory referral to Infectious Disease    Return to clinic based on test results.  History of Present Illness:    Adrian Cruz is a 58 y.o. male who is being seen today for the evaluation of abnormal echocardiogram result at the request of Plotnikov, Oakley Bellman, MD.  Has history of coronary arthrosclerosis with calcium  score 54 from cardiac CT October 2020.  Now presents with findings of abnormal echocardiogram November 09, 2023 showing linear mobile echodensity associated with the aortic valve that also shows calcifications.  Very pleasant man here for the visit by himself.  Keeps himself busy with regular activities and jobs. Mentions he has been working a lot on his knees and scraped his legs at various times.  Over the last few days he was experiencing swelling and redness of the left knee prompting visit to the ER  He had ER visit on 11/01/2023 for redness of his left knee.  This was noted to be associated with swelling.  With normal range of motion and x-ray findings being unremarkable felt to be cellulitis and was started on cephalexin  and doxycycline . Blood cultures from that day came back positive in 1 set as reviewed below.  Blood work from 11/02/2023 blood cultures were negative for growth in 5 days. Another blood work from 11/02/2023 noted Streptococcus species repeat blood cultures ordered for 11/05/2023 pending. C-reactive protein from 11/02/2023 mildly elevated 7.3 and ESR normal 15  Subsequently he had an echocardiogram completed.  Echocardiogram from 11/09/2023  noted LVEF 60 to 65%, normal diastolic function parameters, right ventricle mildly dilated in size with normal function and normal RVSP.There is a mobile echodensity associated with the aortic valve without any significant valve dysfunction TEE was  recommended Images reviewed myself.  Tells me that he has another days dose of antibiotics to continue and is pending blood cultures following that.  Mentions overall his leg symptoms have improved.  He however continues to report symptoms of night sweats for the last few days.  Denies any chest pain but feels more easily tired than usual.  Denies any pedal edema.  Denies any palpitations.  Denies any syncopal or near syncopal episodes.  EKG in the clinic today shows sinus rhythm with heart rate 71/min, PR interval normal 132 ms, QRS duration 90 ms, QRS axis normal, QTc normal 419 ms T wave inversions and repolarization abnormality in lead III aVF.  No significant abnormality in comparison to prior EKG from 2005  Past Medical History:  Diagnosis Date   Angular stomatitis 04/28/2017   12/18  Lotrisone  Rx  Use [employer] Peroxicare tooth paste     Bacteremia due to Streptococcus 11/05/2023   11/01/2023  Likely secondary to left leg cellulitis.  Doubt septic endocarditis etc.  Finish Keflex  and doxycycline .  Then, repeat blood work, obtain another culture.  Cardiac echocardiogram was ordered.  His left leg shows slight infiltration of the skin in the distal anterior thigh and over the proximal part of the kneecap.  Full range of motion of the joint no effusion.  No signs of bursitis  Bot   Cellulitis of left lower extremity 11/05/2023   11/01/2023 finish Keflex  and doxycycline   His left leg shows slight infiltration of the skin in the distal anterior thigh and over the proximal part of the kneecap.  Full range of motion of the joint no effusion.  No signs of bursitis  Both knees with rough hyperpigmented skin due to frequent kneeling.  Use knee pads/soft mat for kneeling.  Wash kneepads and wear pants     Coronary atherosclerosis 09/26/2019   A cardiac CT scan for calcium  score is 54 in 2020  3/22 Increase Lipitor to 40 mg/d to accomplish our goal LDL of 70 or less  Try low dose Lipitor QOD     Cortical  age-related cataract, both eyes 08/18/2019   Costochondritis 09/29/2023   RUQ area  RUQ pain since 2023 off and on. Not better off Lipitor  Ice/heat  Blue-Emu cream was recommended to use 2-3 times a day  Ibuprofen prn     Dry mouth, unspecified 05/24/2017   2018 ?etiology  His dry mouth is 75% better 6/19     Dyslipidemia    Enthesopathy of knee 03/15/2007   Qualifier: Diagnosis of   By: Autry Legions MD, Alveda Aures     IMO SNOMED Dx Update Oct 2024     Fever with chills 01/19/2017   8/18 acute  IMO SNOMED Dx Update Oct 2024     Hematochezia 04/28/2017   12/18 new  Colon 2015 Dr Elvin Hammer     History of colon polyps 12/11/2022   Dr Elvin Hammer  Due colonoscopy 11/2022. He needs it ASAP due to potential loss of health insurance.     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   We discussed age appropriate health related issues, including available/recomended screening tests and vaccinations. Labs were ordered to be later reviewed . All questions were answered. We discussed one or more of the following - seat belt use, use of sunscreen/sun exposure exercise, safe sex, fall risk reduction, second hand smoke exposure, firearm use a    Past Surgical History:  Procedure Laterality Date   CARPAL TUNNEL RELEASE     right wrist 2005   COLONOSCOPY  2019    COLONOSCOPY W/ POLYPECTOMY  2015   1-TA    Current Medications: Current Meds  Medication Sig   atorvastatin  (LIPITOR) 10 MG tablet Take 1 tablet (10 mg total) by mouth daily.   cephALEXin  (KEFLEX ) 500 MG capsule Take 1 capsule (500 mg total) by mouth 4 (four) times daily.   Cholecalciferol (VITAMIN D3) 1000 UNITS CAPS Take 1,000 Units by mouth daily.   doxycycline  (VIBRAMYCIN ) 100 MG capsule Take 1 capsule (100 mg total) by mouth 2 (two) times daily.   [DISCONTINUED] loratadine  (CLARITIN ) 10 MG tablet Take 1 tablet (10 mg total) by mouth daily. (Patient not taking: Reported on 11/11/2023)   [DISCONTINUED] pantoprazole  (PROTONIX ) 40 MG tablet Take 1 tablet (40 mg total) by mouth 2 (two) times daily. (Patient not taking: Reported on 11/11/2023)     Allergies:   Aspirin , Pravastatin , and Simvastatin   Social History   Socioeconomic History   Marital status: Married    Spouse name: Not on file   Number of children: Not on file   Years of education: Not on file   Highest education level: Not on file  Occupational History   Not on file  Tobacco Use   Smoking status: Never   Smokeless tobacco: Never  Vaping Use   Vaping status: Never Used  Substance and Sexual Activity   Alcohol use: Yes    Alcohol/week: 2.0 standard drinks of alcohol    Types: 1 Glasses of wine, 1 Cans of beer per week    Comment: a little   Drug use: Never   Sexual activity: Yes  Other Topics Concern   Not on file  Social History Narrative   Not on file   Social Drivers of Health   Financial Resource Strain: Not on file  Food Insecurity: Not on file  Transportation Needs: Not on file  Physical Activity: Not on file  Stress: Not on file  Social Connections: Not on file     Family History: The patient's family history includes Hypertension in his father. There is no history of Colon cancer, Colon polyps, Esophageal cancer, Rectal cancer, or Stomach cancer. ROS:   Please see the history of present  illness.    All 14 point review of systems negative except as described per history of present illness.  EKGs/Labs/Other Studies Reviewed:    The following studies were reviewed today:   EKG:  EKG  Interpretation Date/Time:  Thursday November 11 2023 08:07:22 EDT Ventricular Rate:  71 PR Interval:  132 QRS Duration:  90 QT Interval:  386 QTC Calculation: 419 R Axis:   64  Text Interpretation: Normal sinus rhythm Abnormal QRS-T angle, consider primary T wave abnormality Abnormal ECG When compared with ECG of 15-Nov-2003 10:07, No significant change was found Confirmed by Bertha Broad reddy [id number]) on 11/11/2023 8:47:44 AM    Recent Labs: 09/28/2023: TSH 2.99 11/11/2023: ALT 19; BUN 19; Creatinine, Ser 0.78; Hemoglobin 13.6; Platelets 353.0; Potassium 3.9; Sodium 137  Recent Lipid Panel    Component Value Date/Time   CHOL 319 (H) 09/28/2023 0842   TRIG 120.0 09/28/2023 0842   HDL 44.30 09/28/2023 0842   CHOLHDL 7 09/28/2023 0842   VLDL 24.0 09/28/2023 0842   LDLCALC 251 (H) 09/28/2023 0842   LDLDIRECT 198.4 11/28/2012 1205    Physical Exam:    VS:  BP 136/84   Pulse 71   Ht 5' 11 (1.803 m)   Wt 217 lb 3.2 oz (98.5 kg)   SpO2 97%   BMI 30.29 kg/m     Wt Readings from Last 3 Encounters:  11/11/23 217 lb 3.2 oz (98.5 kg)  11/05/23 213 lb (96.6 kg)  11/01/23 215 lb (97.5 kg)     GENERAL:  Well nourished, well developed in no acute distress NECK: No JVD; No carotid bruits CARDIAC: RRR, S1 and S2 present, no murmurs, no rubs, no gallops CHEST:  Clear to auscultation without rales, wheezing or rhonchi  Extremities: No pitting pedal edema. Pulses bilaterally symmetric with radial 2+ and dorsalis pedis 2+.  Left knee mild erythematous appearance less in comparison to the photo from June 10 at the ER. NEUROLOGIC:  Alert and oriented x 3  Medication Adjustments/Labs and Tests Ordered: Current medicines are reviewed at length with the patient today.  Concerns regarding  medicines are outlined above.  Orders Placed This Encounter  Procedures   Ambulatory referral to Infectious Disease   EKG 12-Lead   No orders of the defined types were placed in this encounter.   Signed, Lura Sallies, MD, MPH, [facility]. 11/12/2023 8:03 AM    [facility]

## 2023-11-11 NOTE — Assessment & Plan Note (Signed)
 Aortic valve finding on transthoracic echocardiogram thickening mobile echodensity associated with noncoronary and left coronary cusp. No significant valve dysfunction on echocardiogram 11/09/2023.  In the context of recent cellulitis of the left knee and bacteremia with Streptococcus species on blood culture 11/02/2023, he is currently on oral antibiotics with cephalexin  and doxycycline .  Has no significant symptoms of fever but does report type B symptoms such as sweating and easy fatigability and night sweats.  Repeat blood cultures pending, advised him to get these done today.  Will obtain transesophageal echocardiogram for better visualization of the aortic valve, will try to expedite this at [facility].

## 2023-11-12 MED ORDER — DOXYCYCLINE HYCLATE 100 MG PO CAPS: 100.0000 mg | ORAL_CAPSULE | Freq: Two times a day (BID) | ORAL | 0 refills | Status: DC

## 2023-11-12 MED ORDER — CEPHALEXIN 500 MG PO CAPS: 500.0000 mg | ORAL_CAPSULE | Freq: Four times a day (QID) | ORAL | 0 refills | Status: DC

## 2023-11-12 NOTE — Assessment & Plan Note (Signed)
 Currently on antibiotics with Keflex  and doxycycline .  Being managed by PCP. Repeat blood cultures pending, recommended he proceed with this right away and we will place an ID consult to review this further.

## 2023-11-12 NOTE — Progress Notes (Signed)
 Spoke to patient and instructed them to come at [facility]  and to be NPO after 0000.  Medications reviewed.    Confirmed that patient will have a ride home and someone to stay with them for 24 hours after the procedure.

## 2023-11-14 NOTE — Anesthesia Preprocedure Evaluation (Signed)
 Anesthesia Evaluation  Patient identified by MRN, date of birth, ID band Patient awake    Reviewed: Allergy & Precautions, NPO status , Patient's Chart, lab work & pertinent test results  Airway Mallampati: III  TM Distance: >3 FB Neck ROM: Full    Dental no notable dental hx. (+) Teeth Intact, Dental Advisory Given   Pulmonary neg pulmonary ROS   Pulmonary exam normal breath sounds clear to auscultation       Cardiovascular + CAD  Normal cardiovascular exam Rhythm:Regular Rate:Normal  TTE 2025 1. Left ventricular ejection fraction, by estimation, is 60 to 65%. The  left ventricle has normal function. The left ventricle has no regional  wall motion abnormalities. Left ventricular diastolic parameters were  normal.   2. Right ventricular systolic function is normal. The right ventricular  size is mildly enlarged. There is normal pulmonary artery systolic  pressure. The estimated right ventricular systolic pressure is 25.5 mmHg.   3. The mitral valve is degenerative. No evidence of mitral valve  regurgitation. No evidence of mitral stenosis.   4. There is a linear mobile echodensity on aortic side of non coronary  cusp - possible vegetation. The aortic valve is calcified. Aortic valve  regurgitation is not visualized. No aortic stenosis is present.   5. The inferior vena cava is normal in size with greater than 50%  respiratory variability, suggesting right atrial pressure of 3 mmHg.     Neuro/Psych negative neurological ROS  negative psych ROS   GI/Hepatic negative GI ROS, Neg liver ROS,,,  Endo/Other  negative endocrine ROS    Renal/GU negative Renal ROS  negative genitourinary   Musculoskeletal negative musculoskeletal ROS (+)    Abdominal   Peds  Hematology negative hematology ROS (+)   Anesthesia Other Findings 58 year old male patient with no significant cardiac health history and calcium  score of 54 from  October 2020 after recent left knee related symptoms associated with redness and swelling after working extended time while kneeling down had an ER visit, started treatment for cellulitis and blood cultures from the ER visit 1 set was positive for Streptococcus species on 11/02/2023 and has continued on doxycycline  and cephalexin , with improving symptoms, subsequent echocardiogram showing normal biventricular structure and function with mild anemia mobile echodensity associated with aortic valve without any major dysfunction.  Reproductive/Obstetrics                             Anesthesia Physical Anesthesia Plan  ASA: 2  Anesthesia Plan: MAC   Post-op Pain Management:    Induction: Intravenous  PONV Risk Score and Plan: Propofol infusion and Treatment may vary due to age or medical condition  Airway Management Planned: Natural Airway  Additional Equipment:   Intra-op Plan:   Post-operative Plan:   Informed Consent: I have reviewed the patients History and Physical, chart, labs and discussed the procedure including the risks, benefits and alternatives for the proposed anesthesia with the patient or authorized representative who has indicated his/her understanding and acceptance.     Dental advisory given  Plan Discussed with: CRNA  Anesthesia Plan Comments:        Anesthesia Quick Evaluation

## 2023-11-15 ENCOUNTER — Ambulatory Visit (HOSPITAL_COMMUNITY): Payer: Self-pay | Admitting: Anesthesiology

## 2023-11-15 ENCOUNTER — Encounter (HOSPITAL_COMMUNITY)
Admission: RE | Disposition: A | Discharge status: Home / Self Care | Payer: Self-pay | Source: Home / Self Care | Attending: Cardiology

## 2023-11-15 ENCOUNTER — Other Ambulatory Visit: Payer: Self-pay

## 2023-11-15 ENCOUNTER — Ambulatory Visit (HOSPITAL_BASED_OUTPATIENT_CLINIC_OR_DEPARTMENT_OTHER)
Admission: RE | Admit: 2023-11-15 | Discharge: 2023-11-15 | Disposition: A | Discharge status: Home / Self Care | Source: Ambulatory Visit | Attending: Cardiology | Admitting: Cardiology

## 2023-11-15 ENCOUNTER — Ambulatory Visit (HOSPITAL_COMMUNITY)
Admission: RE | Admit: 2023-11-15 | Discharge: 2023-11-15 | Disposition: A | Discharge status: Home / Self Care | Attending: Cardiology | Admitting: Cardiology

## 2023-11-15 ENCOUNTER — Encounter (HOSPITAL_COMMUNITY): Payer: Self-pay | Admitting: Cardiology

## 2023-11-15 DIAGNOSIS — R7881 Bacteremia: Secondary | ICD-10-CM | POA: Diagnosis not present

## 2023-11-15 DIAGNOSIS — I358 Other nonrheumatic aortic valve disorders: Secondary | ICD-10-CM

## 2023-11-15 DIAGNOSIS — I2510 Atherosclerotic heart disease of native coronary artery without angina pectoris: Secondary | ICD-10-CM | POA: Diagnosis not present

## 2023-11-15 DIAGNOSIS — I330 Acute and subacute infective endocarditis: Secondary | ICD-10-CM

## 2023-11-15 HISTORY — PX: TRANSESOPHAGEAL ECHOCARDIOGRAM (CATH LAB): EP1270

## 2023-11-15 LAB — ECHO TEE

## 2023-11-15 SURGERY — TRANSESOPHAGEAL ECHOCARDIOGRAM (TEE) (CATHLAB)
Anesthesia: Monitor Anesthesia Care

## 2023-11-15 MED ORDER — PROPOFOL 10 MG/ML IV BOLUS
INTRAVENOUS | Status: DC | PRN
  Administered 2023-11-15 (×2): 30 mg via INTRAVENOUS
  Administered 2023-11-15: 20 mg via INTRAVENOUS
  Administered 2023-11-15 (×2): 50 mg via INTRAVENOUS
  Administered 2023-11-15: 20 mg via INTRAVENOUS

## 2023-11-15 MED ORDER — SODIUM CHLORIDE 0.9 % IV SOLN: INTRAVENOUS | Status: DC

## 2023-11-15 MED ORDER — LIDOCAINE 2% (20 MG/ML) 5 ML SYRINGE
INTRAMUSCULAR | Status: DC | PRN
Start: 2023-11-15 — End: 2023-11-15
  Administered 2023-11-15: 100 mg via INTRAVENOUS

## 2023-11-15 NOTE — Discharge Instructions (Signed)

## 2023-11-15 NOTE — Progress Notes (Signed)
     Transesophageal Echocardiogram Note  Adrian Cruz [id number] [birth year]  Procedure: Transesophageal Echocardiogram Indications: Bacteremia  Procedure Details Consent: Obtained Time Out: Verified patient identification, verified procedure, site/side was marked, verified correct patient position, special equipment/implants available, Radiology Safety Procedures followed,  medications/allergies/relevent history reviewed, required imaging and test results available.  Performed  Medications:  Pt sedated by anesthesia with diprovan 200 mg IV total.  Normal LV function; trace AI, MR, TR and PI; no vegetations noted.   Complications: No apparent complications Patient did tolerate procedure well.  Redell Shallow, MD

## 2023-11-15 NOTE — Transfer of Care (Signed)
 Immediate Anesthesia Transfer of Care Note  Patient: Adrian Cruz  Procedure(s) Performed: TRANSESOPHAGEAL ECHOCARDIOGRAM  Patient Location: Cath Lab  Anesthesia Type:MAC  Level of Consciousness: awake, alert , and oriented  Airway & Oxygen Therapy: Patient Spontanous Breathing and Patient connected to nasal cannula oxygen  Post-op Assessment: Report given to RN and Post -op Vital signs reviewed and stable  Post vital signs: Reviewed and stable  Last Vitals:  Vitals Value Taken Time  BP    Temp    Pulse    Resp    SpO2      Last Pain:  Vitals:   11/15/23 0754  TempSrc:   PainSc: 0-No pain         Complications: No notable events documented.

## 2023-11-15 NOTE — H&P (Signed)
 Office Visit 11/11/2023 [facility] [place]    Adrian, Adrian SAUNDERS, MD Cardiology Aortic valve vegetation +1 more Dx Referred by Plotnikov, Karlynn GAILS, MD Reason for Visit   Additional Documentation  Vitals: BP 136/84   Pulse 71   Ht 5' 11 (1.803 m)   Wt 98.5 kg   SpO2 97%   BMI 30.29 kg/m   BSA 2.22 m  Flowsheets: NEWS,   MEWS Score,   Vital Signs,   Anthropometrics,   Data  Encounter Info: Billing Info,   History,   Allergies,   Detailed Report   All Notes   Progress Notes by Liborio Adrian SAUNDERS, MD at 11/11/2023 8:00 AM  Author: MadireddyAlean SAUNDERS, MD Author Type: Physician Filed: 11/12/2023  8:04 AM  Note Status: Signed Cosign: Cosign Not Required Encounter Date: 11/11/2023  Editor: Adrian, Adrian SAUNDERS, MD (Physician)             Expand All Collapse All    Cardiology Consultation:     Date:  11/12/2023    ID:  Adrian Cruz, DOB 1965/06/22, MRN [id number]   PCP:  Adrian Karlynn GAILS, MD           Cardiologist:  Adrian Cruz Madireddy, MD    Referring MD: Adrian Karlynn GAILS, MD    No chief complaint on file.       ASSESSMENT AND PLAN:   [name] 58 year old male patient with no significant cardiac health history and calcium  score of 54 from October 2020 after recent left knee related symptoms associated with redness and swelling after working extended time while kneeling down had an ER visit, started treatment for cellulitis and blood cultures from the ER visit 1 set was positive for Streptococcus species on 11/02/2023 and has continued on doxycycline  and cephalexin , with improving symptoms, subsequent echocardiogram showing normal biventricular structure and function with mild anemia mobile echodensity associated with aortic valve without any major dysfunction.     Problem List Items Addressed This Visit       Bacteremia    Currently on antibiotics with Keflex  and doxycycline .  Being managed by PCP. Repeat  blood cultures pending, recommended he proceed with this right away and we will place an ID consult to review this further.        Relevant Orders    Ambulatory referral to Infectious Disease    Aortic valve vegetation - Primary    Aortic valve finding on transthoracic echocardiogram thickening mobile echodensity associated with noncoronary and left coronary cusp. No significant valve dysfunction on echocardiogram 11/09/2023.   In the context of recent cellulitis of the left knee and bacteremia with Streptococcus species on blood culture 11/02/2023, he is currently on oral antibiotics with cephalexin  and doxycycline .   Has no significant symptoms of fever but does report type B symptoms such as sweating and easy fatigability and night sweats.   Repeat blood cultures pending, advised him to get these done today.   Will obtain transesophageal echocardiogram for better visualization of the aortic valve, will try to expedite this at [facility].            Relevant Orders    EKG 12-Lead (Completed)    Ambulatory referral to Infectious Disease      Return to clinic based on test results.   History of Present Illness:     [name] is a 58 y.o. male who is being seen today for the evaluation of abnormal echocardiogram result at the request of  Plotnikov, Karlynn GAILS, MD.   Has history of coronary arthrosclerosis with calcium  score 54 from cardiac CT October 2020.  Now presents with findings of abnormal echocardiogram November 09, 2023 showing linear mobile echodensity associated with the aortic valve that also shows calcifications.   Very pleasant man here for the visit by himself.  Keeps himself busy with regular activities and jobs. Mentions he has been working a lot on his knees and scraped his legs at various times.  Over the last few days he was experiencing swelling and redness of the left knee prompting visit to the ER   He had ER visit on 11/01/2023 for redness of his left  knee.  This was noted to be associated with swelling.  With normal range of motion and x-ray findings being unremarkable felt to be cellulitis and was started on cephalexin  and doxycycline . Blood cultures from that day came back positive in 1 set as reviewed below.   Blood work from 11/02/2023 blood cultures were negative for growth in 5 days. Another blood work from 11/02/2023 noted Streptococcus species repeat blood cultures ordered for 11/05/2023 pending. C-reactive protein from 11/02/2023 mildly elevated 7.3 and ESR normal 15   Subsequently he had an echocardiogram completed.   Echocardiogram from 11/09/2023 noted LVEF 60 to 65%, normal diastolic function parameters, right ventricle mildly dilated in size with normal function and normal RVSP.There is a mobile echodensity associated with the aortic valve without any significant valve dysfunction TEE was recommended Images reviewed myself.   Tells me that he has another days dose of antibiotics to continue and is pending blood cultures following that.   Mentions overall his leg symptoms have improved.  He however continues to report symptoms of night sweats for the last few days.  Denies any chest pain but feels more easily tired than usual.  Denies any pedal edema.  Denies any palpitations.  Denies any syncopal or near syncopal episodes.   EKG in the clinic today shows sinus rhythm with heart rate 71/min, PR interval normal 132 ms, QRS duration 90 ms, QRS axis normal, QTc normal 419 ms T wave inversions and repolarization abnormality in lead III aVF.  No significant abnormality in comparison to prior EKG from 2005       Past Medical History:  Diagnosis Date   Angular stomatitis 04/28/2017    12/18  Lotrisone  Rx  Use [employer] Peroxicare tooth paste     Bacteremia due to Streptococcus 11/05/2023    11/01/2023  Likely secondary to left leg cellulitis.  Doubt septic endocarditis etc.  Finish Keflex  and doxycycline .  Then, repeat blood work, obtain  another culture.  Cardiac echocardiogram was ordered.  His left leg shows slight infiltration of the skin in the distal anterior thigh and over the proximal part of the kneecap.  Full range of motion of the joint no effusion.  No signs of bursitis  Bot   Cellulitis of left lower extremity 11/05/2023    11/01/2023 finish Keflex  and doxycycline   His left leg shows slight infiltration of the skin in the distal anterior thigh and over the proximal part of the kneecap.  Full range of motion of the joint no effusion.  No signs of bursitis  Both knees with rough hyperpigmented skin due to frequent kneeling.  Use knee pads/soft mat for kneeling.  Wash kneepads and wear pants     Coronary atherosclerosis 09/26/2019    A cardiac CT scan for calcium  score is 54 in 2020  3/22 Increase Lipitor to  40 mg/d to accomplish our goal LDL of 70 or less  Try low dose Lipitor QOD     Cortical age-related cataract, both eyes 08/18/2019   Costochondritis 09/29/2023    RUQ area  RUQ pain since 2023 off and on. Not better off Lipitor  Ice/heat  Blue-Emu cream was recommended to use 2-3 times a day  Ibuprofen prn     Dry mouth, unspecified 05/24/2017    2018 ?etiology  His dry mouth is 75% better 6/19     Dyslipidemia     Enthesopathy of knee 03/15/2007    Qualifier: Diagnosis of   By: Norleen MD, Lynwood ORN     IMO SNOMED Dx Update Oct 2024     Fever with chills 01/19/2017    8/18 acute  IMO SNOMED Dx Update Oct 2024     Hematochezia 04/28/2017    12/18 new  Colon 2015 Dr Abran     History of colon polyps 12/11/2022    Dr Abran  Due colonoscopy 11/2022. He needs it ASAP due to potential loss of health insurance.     Hyperlipidemia     Hypermetropia of both eyes 08/18/2019   Kidney stones 05/25/2005    h/o urology consult   Myalgia 12/11/2011    Due to Lipitor     Nuclear sclerotic cataract of both eyes 08/18/2019   Otitis externa 03/17/2012    10/13 R     Presbyopia 08/18/2019   Prostatism 11/30/2012    Chronic       PROSTATITIS, ACUTE 10/30/2009    Recurrent   2011,2013  S/p Urol consult     Pseudopapilledema of optic disc, bilateral 08/18/2019   RUQ pain 09/21/2022    Likely due to meds - Try low dose Lipitor QOD  Sx's resolved off Lipitor  RUQ US  if relapsed     Snoring 09/26/2019    Dentist suggested a sleep study, snoring and teeth grinding     Well adult exam 01/06/2011    Coronary calcium  CT score of 54 - 2020.   We discussed age appropriate health related issues, including available/recomended screening tests and vaccinations. Labs were ordered to be later reviewed . All questions were answered. We discussed one or more of the following - seat belt use, use of sunscreen/sun exposure exercise, safe sex, fall risk reduction, second hand smoke exposure, firearm use a               Past Surgical History:  Procedure Laterality Date   CARPAL TUNNEL RELEASE        right wrist 2005   COLONOSCOPY   2019   COLONOSCOPY W/ POLYPECTOMY   2015    1-TA          Current Medications: Active Medications      Current Meds  Medication Sig   atorvastatin  (LIPITOR) 10 MG tablet Take 1 tablet (10 mg total) by mouth daily.   cephALEXin  (KEFLEX ) 500 MG capsule Take 1 capsule (500 mg total) by mouth 4 (four) times daily.   Cholecalciferol (VITAMIN D3) 1000 UNITS CAPS Take 1,000 Units by mouth daily.   doxycycline  (VIBRAMYCIN ) 100 MG capsule Take 1 capsule (100 mg total) by mouth 2 (two) times daily.   [DISCONTINUED] loratadine  (CLARITIN ) 10 MG tablet Take 1 tablet (10 mg total) by mouth daily. (Patient not taking: Reported on 11/11/2023)   [DISCONTINUED] pantoprazole  (PROTONIX ) 40 MG tablet Take 1 tablet (40 mg total) by mouth 2 (two) times daily. (Patient not taking: Reported on  11/11/2023)        Allergies:   Aspirin , Pravastatin , and Simvastatin    Social History         Socioeconomic History   Marital status: Married      Spouse name: Not on file   Number of children: Not on file   Years of  education: Not on file   Highest education level: Not on file  Occupational History   Not on file  Tobacco Use   Smoking status: Never   Smokeless tobacco: Never  Vaping Use   Vaping status: Never Used  Substance and Sexual Activity   Alcohol use: Yes      Alcohol/week: 2.0 standard drinks of alcohol      Types: 1 Glasses of wine, 1 Cans of beer per week      Comment: a little   Drug use: Never   Sexual activity: Yes  Other Topics Concern   Not on file  Social History Narrative   Not on file    Social Drivers of Health    Financial Resource Strain: Not on file  Food Insecurity: Not on file  Transportation Needs: Not on file  Physical Activity: Not on file  Stress: Not on file  Social Connections: Not on file      Family History: The patient's family history includes Hypertension in his father. There is no history of Colon cancer, Colon polyps, Esophageal cancer, Rectal cancer, or Stomach cancer. ROS:   Please see the history of present illness.    All 14 point review of systems negative except as described per history of present illness.   EKGs/Labs/Other Studies Reviewed:     The following studies were reviewed today:    EKG:  EKG Interpretation Date/Time:                  Thursday November 11 2023 08:07:22 EDT Ventricular Rate:         71 PR Interval:                 132 QRS Duration:             90 QT Interval:                 386 QTC Calculation:419 R Axis:                         64   Text Interpretation:Normal sinus rhythm Abnormal QRS-T angle, consider primary T wave abnormality Abnormal ECG When compared with ECG of 15-Nov-2003 10:07, No significant change was found Confirmed by Liborio Hai reddy [phone]) on 11/11/2023 8:47:44 AM     Recent Labs: 09/28/2023: TSH 2.99 11/11/2023: ALT 19; BUN 19; Creatinine, Ser 0.78; Hemoglobin 13.6; Platelets 353.0; Potassium 3.9; Sodium 137  Recent Lipid Panel Labs (Brief)          Component Value Date/Time    CHOL  319 (H) 09/28/2023 0842    TRIG 120.0 09/28/2023 0842    HDL 44.30 09/28/2023 0842    CHOLHDL 7 09/28/2023 0842    VLDL 24.0 09/28/2023 0842    LDLCALC 251 (H) 09/28/2023 0842    LDLDIRECT 198.4 11/28/2012 1205        Physical Exam:     VS:  BP 136/84   Pulse 71   Ht 5' 11 (1.803 m)   Wt 217 lb 3.2 oz (98.5 kg)   SpO2 97%   BMI 30.29 kg/m  Wt Readings from Last 3 Encounters:  11/11/23 217 lb 3.2 oz (98.5 kg)  11/05/23 213 lb (96.6 kg)  11/01/23 215 lb (97.5 kg)      GENERAL:  Well nourished, well developed in no acute distress NECK: No JVD; No carotid bruits CARDIAC: RRR, S1 and S2 present, no murmurs, no rubs, no gallops CHEST:  Clear to auscultation without rales, wheezing or rhonchi  Extremities: No pitting pedal edema. Pulses bilaterally symmetric with radial 2+ and dorsalis pedis 2+.  Left knee mild erythematous appearance less in comparison to the photo from June 10 at the ER. NEUROLOGIC:  Alert and oriented x 3   Medication Adjustments/Labs and Tests Ordered: Current medicines are reviewed at length with the patient today.  Concerns regarding medicines are outlined above.     Orders Placed This Encounter  Procedures   Ambulatory referral to Infectious Disease   EKG 12-Lead    No orders of the defined types were placed in this encounter.     Signed, Sreedhar reddy Madireddy, MD, MPH, [facility]. 11/12/2023 8:03 AM    [facility]      For TEE to R/O vegetation; no changes. Redell Shallow

## 2023-11-16 LAB — CULTURE, BLOOD (SINGLE)

## 2023-11-16 NOTE — Anesthesia Postprocedure Evaluation (Signed)
 Anesthesia Post Note  Patient: Adrian Cruz  Procedure(s) Performed: TRANSESOPHAGEAL ECHOCARDIOGRAM     Patient location during evaluation: Cath Lab Anesthesia Type: MAC Level of consciousness: awake and alert Pain management: pain level controlled Vital Signs Assessment: post-procedure vital signs reviewed and stable Respiratory status: spontaneous breathing, nonlabored ventilation, respiratory function stable and patient connected to nasal cannula oxygen Cardiovascular status: blood pressure returned to baseline and stable Postop Assessment: no apparent nausea or vomiting Anesthetic complications: no  No notable events documented.  Last Vitals:  Vitals:   11/15/23 0850 11/15/23 0900  BP: 123/80 (!) 123/96  Pulse: 68 66  Resp: 13 12  Temp:  36.7 C  SpO2: 98% 99%    Last Pain:  Vitals:   11/15/23 0900  TempSrc: Temporal  PainSc: 0-No pain   Pain Goal:                   Kamirah Shugrue L Linzy Laury

## 2023-11-17 ENCOUNTER — Other Ambulatory Visit (HOSPITAL_COMMUNITY)

## 2023-11-18 ENCOUNTER — Ambulatory Visit (INDEPENDENT_AMBULATORY_CARE_PROVIDER_SITE_OTHER): Admitting: Internal Medicine

## 2023-11-18 ENCOUNTER — Other Ambulatory Visit: Payer: Self-pay

## 2023-11-18 ENCOUNTER — Encounter: Payer: Self-pay | Admitting: Internal Medicine

## 2023-11-18 VITALS — BP 147/84 | HR 69 | Temp 98.0°F | Wt 215.0 lb

## 2023-11-18 DIAGNOSIS — R7881 Bacteremia: Secondary | ICD-10-CM | POA: Diagnosis not present

## 2023-11-18 MED ORDER — LINEZOLID 600 MG PO TABS
600.0000 mg | ORAL_TABLET | Freq: Two times a day (BID) | ORAL | 0 refills | Status: AC
Start: 2023-11-18 — End: 2023-12-02

## 2023-11-18 NOTE — Patient Instructions (Signed)
 Stop keflex  and doxy Start linezolid twice a day F/u in one month

## 2023-11-18 NOTE — Progress Notes (Signed)
 Patient: Adrian Cruz  DOB: [birth year] MRN: [id number] PCP: Garald Karlynn GAILS, MD    Patient Active Problem List   Diagnosis Date Noted   Aortic valve vegetation 11/11/2023   Hyperlipidemia    Cellulitis of left lower extremity 11/05/2023   Bacteremia 11/05/2023   Costochondritis 09/29/2023   History of colon polyps 12/11/2022   RUQ pain 09/21/2022   Coronary atherosclerosis 09/26/2019   Snoring 09/26/2019   Presbyopia 08/18/2019   Pseudopapilledema of optic disc, bilateral 08/18/2019   Nuclear sclerotic cataract of both eyes 08/18/2019   Hypermetropia of both eyes 08/18/2019   Cortical age-related cataract, both eyes 08/18/2019   Dry mouth, unspecified 05/24/2017   Hematochezia 04/28/2017   Angular stomatitis 04/28/2017   Fever with chills 01/19/2017   Prostatism 11/30/2012   Otitis externa 03/17/2012   Myalgia 12/11/2011   Well adult exam 01/06/2011   PROSTATITIS, ACUTE 10/30/2009   HEMATURIA, MICROSCOPIC, HX OF 12/10/2008   Enthesopathy of knee 03/15/2007   Dyslipidemia 02/03/2007   Kidney stones 05/25/2005     Subjective:  Adrian Cruz is a 58 y.o. male with past medical history of angular stomatitis, coronary arthrosclerosis, dyslipidemia, otitis externa, kidney stones, presents for follow-up ofStrep para Signos bacteremia.  Patient had an abnormal echo on November 09, 2023 showing linear mobile echodensity so your aortic valve that also shows calcification.  Over the last few days patient is experiencing swelling around the left knee as well.  He had a ED visit on 6/9 for redness of his left knee with associated swelling.  He was discharged on doxycycline  and Keflex .  Blood cultures returned 1/2 sets Streptococcus para sanguinous.  On 6/23 when he was admitted for TEE patient noted that his symptoms of his leg resolved but he was having night sweats for few days.  Repeat blood culture on 6/19 no growth.  TEE was done on TEE was done on 6/23 which did not  show vegetation.  TEE was done on 6/23 which did not show vegetation.  He was referred to infectious disease for bacteremia.  Review of Systems  All other systems reviewed and are negative.   Past Medical History:  Diagnosis Date   Angular stomatitis 04/28/2017   12/18  Lotrisone  Rx  Use [employer] Peroxic[employer] tooth paste     Bacteremia due to Streptococcus 11/05/2023   11/01/2023  Likely secondary to left leg cellulitis.  Doubt septic endocarditis etc.  Finish Keflex  and doxycycline .  Then, repeat blood work, obtain another culture.  Cardiac echocardiogram was ordered.  His left leg shows slight infiltration of the skin in the distal anterior thigh and over the proximal part of the kneecap.  Full range of motion of the joint no effusion.  No signs of bursitis  Bot   Cellulitis of left lower extremity 11/05/2023   11/01/2023 finish Keflex  and doxycycline   His left leg shows slight infiltration of the skin in the distal anterior thigh and over the proximal part of the kneecap.  Full range of motion of the joint no effusion.  No signs of bursitis  Both knees with rough hyperpigmented skin due to frequent kneeling.  Use knee pads/soft mat for kneeling.  Wash kneepads and wear pants     Coronary atherosclerosis 09/26/2019   A cardiac CT scan for calcium  score is 54 in 2020  3/22 Increase Lipitor to 40 mg/d to accomplish our goal LDL of 70 or less  Try low dose Lipitor QOD     Cortical age-related cataract,  both eyes 08/18/2019   Costochondritis 09/29/2023   RUQ area  RUQ pain since 2023 off and on. Not better off Lipitor  Ice/heat  Blue-Emu cream was recommended to use 2-3 times a day  Ibuprofen prn     Dry mouth, unspecified 05/24/2017   2018 ?etiology  His dry mouth is 75% better 6/19     Dyslipidemia    Enthesopathy of knee 03/15/2007   Qualifier: Diagnosis of   By: Norleen MD, Lynwood ORN     IMO SNOMED Dx Update Oct 2024     Fever with chills 01/19/2017   8/18 acute  IMO SNOMED Dx Update Oct 2024      Hematochezia 04/28/2017   12/18 new  Colon 2015 Dr Abran     History of colon polyps 12/11/2022   Dr Abran  Due colonoscopy 11/2022. He needs it ASAP due to potential loss of health insurance.     Hyperlipidemia    Hypermetropia of both eyes 08/18/2019   Kidney stones 05/25/2005   h/o urology consult   Myalgia 12/11/2011   Due to Lipitor     Nuclear sclerotic cataract of both eyes 08/18/2019   Otitis externa 03/17/2012   10/13 R     Presbyopia 08/18/2019   Prostatism 11/30/2012   Chronic      PROSTATITIS, ACUTE 10/30/2009   Recurrent   2011,2013  S/p Urol consult     Pseudopapilledema of optic disc, bilateral 08/18/2019   RUQ pain 09/21/2022   Likely due to meds - Try low dose Lipitor QOD  Sx's resolved off Lipitor  RUQ US  if relapsed     Snoring 09/26/2019   Dentist suggested a sleep study, snoring and teeth grinding     Well adult exam 01/06/2011   Coronary calcium  CT score of 54 - 2020.   We discussed age appropriate health related issues, including available/recomended screening tests and vaccinations. Labs were ordered to be later reviewed . All questions were answered. We discussed one or more of the following - seat belt use, use of sunscreen/sun exposure exercise, safe sex, fall risk reduction, second hand smoke exposure, firearm use a    Outpatient Medications Prior to Visit  Medication Sig Dispense Refill   atorvastatin  (LIPITOR) 10 MG tablet Take 1 tablet (10 mg total) by mouth daily. 90 tablet 3   cephALEXin  (KEFLEX ) 500 MG capsule Take 1 capsule (500 mg total) by mouth 4 (four) times daily. 40 capsule 0   Cholecalciferol (VITAMIN D3) 1000 UNITS CAPS Take 1,000 Units by mouth daily.     doxycycline  (VIBRAMYCIN ) 100 MG capsule Take 1 capsule (100 mg total) by mouth 2 (two) times daily. 20 capsule 0   No facility-administered medications prior to visit.     Allergies  Allergen Reactions   Aspirin      Blood in stool   Pravastatin      abd pain   Simvastatin      REACTION: rash    Social History   Tobacco Use   Smoking status: Never   Smokeless tobacco: Never  Vaping Use   Vaping status: Never Used  Substance Use Topics   Alcohol use: Yes    Alcohol/week: 2.0 standard drinks of alcohol    Types: 1 Glasses of wine, 1 Cans of beer per week    Comment: a little   Drug use: Never    Family History  Problem Relation Age of Onset   Hypertension Father    Colon cancer Neg Hx  Colon polyps Neg Hx    Esophageal cancer Neg Hx    Rectal cancer Neg Hx    Stomach cancer Neg Hx     Objective:  There were no vitals filed for this visit. There is no height or weight on file to calculate BMI.  Physical Exam Constitutional:      General: He is not in acute distress.    Appearance: He is normal weight. He is not toxic-appearing.  HENT:     Head: Normocephalic and atraumatic.     Right Ear: External ear normal.     Left Ear: External ear normal.     Nose: No congestion or rhinorrhea.     Mouth/Throat:     Mouth: Mucous membranes are moist.     Pharynx: Oropharynx is clear.   Eyes:     Extraocular Movements: Extraocular movements intact.     Conjunctiva/sclera: Conjunctivae normal.     Pupils: Pupils are equal, round, and reactive to light.    Cardiovascular:     Rate and Rhythm: Normal rate and regular rhythm.     Heart sounds: No murmur heard.    No friction rub. No gallop.  Pulmonary:     Effort: Pulmonary effort is normal.     Breath sounds: Normal breath sounds.  Abdominal:     General: Abdomen is flat. Bowel sounds are normal.     Palpations: Abdomen is soft.   Musculoskeletal:        General: No swelling. Normal range of motion.     Cervical back: Normal range of motion and neck supple.   Skin:    General: Skin is warm and dry.   Neurological:     General: No focal deficit present.     Mental Status: He is oriented to person, place, and time.   Psychiatric:        Mood and Affect: Mood normal.     Lab  Results: Lab Results  Component Value Date   WBC 8.6 11/11/2023   HGB 13.6 11/11/2023   HCT 41.3 11/11/2023   MCV 84.2 11/11/2023   PLT 353.0 11/11/2023    Lab Results  Component Value Date   CREATININE 0.78 11/11/2023   BUN 19 11/11/2023   NA 137 11/11/2023   K 3.9 11/11/2023   CL 105 11/11/2023   CO2 27 11/11/2023    Lab Results  Component Value Date   ALT 19 11/11/2023   AST 18 11/11/2023   ALKPHOS 49 11/11/2023   BILITOT 0.4 11/11/2023     Assessment & Plan:  #strep para sanguinis bacteremia(Pen intermediate) 2/2 cellulits #tte c/f veg, tee negative - Pt was in the ED on 6/10 due to leg cellulitis.  Discharged on Keflex  and doxycycline  x10 days.  Blood cultures returned from 6/10 1/2 strep. - Patient was called to come back to the ED.  He did not present again to the ED - Seen by internal medicine, he was then admitted for TEE - TEE showed no vegetation, referred to infectious disease 6/19 blood Cx ng , wbc 8.6k. he was given addiotnal 6 days of keflex .  Plan -Linezolid  x 2 weeks given, no cefazolin sens although blood Cx cleared(suspect keflex  sensitive).  Erythema at left lower extremity improved. -F/U with ID PRN  Loney Stank, MD [facility]   11/18/23  8:55 AM I have personally spent 65 minutes involved in face-to-face and non-face-to-face activities for this patient on the day of  the visit. Professional time spent includes the following activities: Preparing to see the patient (review of tests), Obtaining and/or reviewing separately obtained history (admission/discharge record), Performing a medically appropriate examination and/or evaluation , Ordering medications/tests/procedures, referring and communicating with other health care professionals, Documenting clinical information in the EMR, Independently interpreting results (not separately reported), Communicating results to the patient/family/caregiver,  Counseling and educating the patient/family/caregiver and Care coordination (not separately reported).

## 2023-11-24 ENCOUNTER — Ambulatory Visit: Payer: Self-pay

## 2023-11-25 ENCOUNTER — Ambulatory Visit: Admitting: Infectious Diseases

## 2023-12-01 ENCOUNTER — Ambulatory Visit: Admitting: Cardiology

## 2023-12-21 ENCOUNTER — Ambulatory Visit (INDEPENDENT_AMBULATORY_CARE_PROVIDER_SITE_OTHER): Admitting: Internal Medicine

## 2023-12-21 ENCOUNTER — Encounter: Payer: Self-pay | Admitting: Internal Medicine

## 2023-12-21 ENCOUNTER — Other Ambulatory Visit: Payer: Self-pay

## 2023-12-21 VITALS — BP 142/87 | HR 60 | Temp 97.6°F | Ht 71.0 in | Wt 212.0 lb

## 2023-12-21 DIAGNOSIS — R7881 Bacteremia: Secondary | ICD-10-CM

## 2023-12-21 NOTE — Progress Notes (Signed)
 Patient: Adrian Cruz  DOB: 30-Nov-1965 MRN: [id number] PCP: Garald Karlynn GAILS, MD   Patient Active Problem List   Diagnosis Date Noted   Aortic valve vegetation 11/11/2023   Hyperlipidemia    Cellulitis of left lower extremity 11/05/2023   Bacteremia 11/05/2023   Costochondritis 09/29/2023   History of colon polyps 12/11/2022   RUQ pain 09/21/2022   Coronary atherosclerosis 09/26/2019   Snoring 09/26/2019   Presbyopia 08/18/2019   Pseudopapilledema of optic disc, bilateral 08/18/2019   Nuclear sclerotic cataract of both eyes 08/18/2019   Hypermetropia of both eyes 08/18/2019   Cortical age-related cataract, both eyes 08/18/2019   Dry mouth, unspecified 05/24/2017   Hematochezia 04/28/2017   Angular stomatitis 04/28/2017   Fever with chills 01/19/2017   Prostatism 11/30/2012   Otitis externa 03/17/2012   Myalgia 12/11/2011   Well adult exam 01/06/2011   PROSTATITIS, ACUTE 10/30/2009   HEMATURIA, MICROSCOPIC, HX OF 12/10/2008   Enthesopathy of knee 03/15/2007   Dyslipidemia 02/03/2007   Kidney stones 05/25/2005     Subjective:  Adrian Cruz is a  58 y.o. male with past medical history of angular stomatitis, coronary arthrosclerosis, dyslipidemia, otitis externa, kidney stones, presents for follow-up ofStrep para Signos bacteremia.  Patient had an abnormal echo on November 09, 2023 showing linear mobile echodensity so your aortic valve that also shows calcification.  Over the last few days patient is experiencing swelling around the left knee as well.  He had a ED visit on 6/9 for redness of his left knee with associated swelling.  He was discharged on doxycycline  and Keflex .  Blood cultures returned 1/2 sets Streptococcus para sanguinous.  On 6/23 when he was admitted for TEE patient noted that his symptoms of his leg resolved but he was having night sweats for few days.  Repeat blood culture on 6/19 no growth.  TEE was done on TEE was done on 6/23 which did not  show vegetation.  TEE was done on 6/23 which did not show vegetation.  He was referred to infectious disease for bacteremia.  Today: completed linezolid . No fevers or chills.  Review of Systems  All other systems reviewed and are negative.   Past Medical History:  Diagnosis Date   Angular stomatitis 04/28/2017   12/18  Lotrisone  Rx  Use [employer] Peroxicare tooth paste     Bacteremia due to Streptococcus 11/05/2023   11/01/2023  Likely secondary to left leg cellulitis.  Doubt septic endocarditis etc.  Finish Keflex  and doxycycline .  Then, repeat blood work, obtain another culture.  Cardiac echocardiogram was ordered.  His left leg shows slight infiltration of the skin in the distal anterior thigh and over the proximal part of the kneecap.  Full range of motion of the joint no effusion.  No signs of bursitis  Bot   Cellulitis of left lower extremity 11/05/2023   11/01/2023 finish Keflex  and doxycycline   His left leg shows slight infiltration of the skin in the distal anterior thigh and over the proximal part of the kneecap.  Full range of motion of the joint no effusion.  No signs of bursitis  Both knees with rough hyperpigmented skin due to frequent kneeling.  Use knee pads/soft mat for kneeling.  Wash kneepads and wear pants     Coronary atherosclerosis 09/26/2019   A cardiac CT scan for calcium  score is 54 in 2020  3/22 Increase Lipitor to 40 mg/d to accomplish our goal LDL of 70 or less  Try low dose Lipitor  QOD     Cortical age-related cataract, both eyes 08/18/2019   Costochondritis 09/29/2023   RUQ area  RUQ pain since 2023 off and on. Not better off Lipitor  Ice/heat  Blue-Emu cream was recommended to use 2-3 times a day  Ibuprofen prn     Dry mouth, unspecified 05/24/2017   2018 ?etiology  His dry mouth is 75% better 6/19     Dyslipidemia    Enthesopathy of knee 03/15/2007   Qualifier: Diagnosis of   By: Norleen MD, [place] ORN     IMO SNOMED Dx Update Oct 2024     Fever with chills 01/19/2017    8/18 acute  IMO SNOMED Dx Update Oct 2024     Hematochezia 04/28/2017   12/18 new  Colon 2015 Dr Abran     History of colon polyps 12/11/2022   Dr Abran  Due colonoscopy 11/2022. He needs it ASAP due to potential loss of health insurance.     Hyperlipidemia    Hypermetropia of both eyes 08/18/2019   Kidney stones 05/25/2005   h/o urology consult   Myalgia 12/11/2011   Due to Lipitor     Nuclear sclerotic cataract of both eyes 08/18/2019   Otitis externa 03/17/2012   10/13 R     Presbyopia 08/18/2019   Prostatism 11/30/2012   Chronic      PROSTATITIS, ACUTE 10/30/2009   Recurrent   2011,2013  S/p Urol consult     Pseudopapilledema of optic disc, bilateral 08/18/2019   RUQ pain 09/21/2022   Likely due to meds - Try low dose Lipitor QOD  Sx's resolved off Lipitor  RUQ US  if relapsed     Snoring 09/26/2019   Dentist suggested a sleep study, snoring and teeth grinding     Well adult exam 01/06/2011   Coronary calcium  CT score of 54 - 2020.   We discussed age appropriate health related issues, including available/recomended screening tests and vaccinations. Labs were ordered to be later reviewed . All questions were answered. We discussed one or more of the following - seat belt use, use of sunscreen/sun exposure exercise, safe sex, fall risk reduction, second hand smoke exposure, firearm use a    Outpatient Medications Prior to Visit  Medication Sig Dispense Refill   atorvastatin  (LIPITOR) 10 MG tablet Take 1 tablet (10 mg total) by mouth daily. 90 tablet 3   Cholecalciferol (VITAMIN D3) 1000 UNITS CAPS Take 1,000 Units by mouth daily.     No facility-administered medications prior to visit.     Allergies  Allergen Reactions   Aspirin      Blood in stool   Pravastatin      abd pain   Simvastatin     REACTION: rash    Social History   Tobacco Use   Smoking status: Never   Smokeless tobacco: Never  Vaping Use   Vaping status: Never Used  Substance Use Topics   Alcohol  use: Yes    Alcohol/week: 2.0 standard drinks of alcohol    Types: 1 Glasses of wine, 1 Cans of beer per week    Comment: a little   Drug use: Never    Family History  Problem Relation Age of Onset   Hypertension Father    Colon cancer Neg Hx    Colon polyps Neg Hx    Esophageal cancer Neg Hx    Rectal cancer Neg Hx    Stomach cancer Neg Hx     Objective:  There were no vitals filed  for this visit. There is no height or weight on file to calculate BMI.  Physical Exam Constitutional:      General: He is not in acute distress.    Appearance: He is normal weight. He is not toxic-appearing.  HENT:     Head: Normocephalic and atraumatic.     Right Ear: External ear normal.     Left Ear: External ear normal.     Nose: No congestion or rhinorrhea.     Mouth/Throat:     Mouth: Mucous membranes are moist.     Pharynx: Oropharynx is clear.  Eyes:     Extraocular Movements: Extraocular movements intact.     Conjunctiva/sclera: Conjunctivae normal.     Pupils: Pupils are equal, round, and reactive to light.  Cardiovascular:     Rate and Rhythm: Normal rate and regular rhythm.     Heart sounds: No murmur heard.    No friction rub. No gallop.  Pulmonary:     Effort: Pulmonary effort is normal.     Breath sounds: Normal breath sounds.  Abdominal:     General: Abdomen is flat. Bowel sounds are normal.     Palpations: Abdomen is soft.  Musculoskeletal:        General: No swelling. Normal range of motion.     Cervical back: Normal range of motion and neck supple.  Skin:    General: Skin is warm and dry.  Neurological:     General: No focal deficit present.     Mental Status: He is oriented to person, place, and time.  Psychiatric:        Mood and Affect: Mood normal.     Lab Results: Lab Results  Component Value Date   WBC 8.6 11/11/2023   HGB 13.6 11/11/2023   HCT 41.3 11/11/2023   MCV 84.2 11/11/2023   PLT 353.0 11/11/2023    Lab Results  Component Value Date    CREATININE 0.78 11/11/2023   BUN 19 11/11/2023   NA 137 11/11/2023   K 3.9 11/11/2023   CL 105 11/11/2023   CO2 27 11/11/2023    Lab Results  Component Value Date   ALT 19 11/11/2023   AST 18 11/11/2023   ALKPHOS 49 11/11/2023   BILITOT 0.4 11/11/2023     Assessment & Plan:  #strep para sanguinis bacteremia(Pen intermediate) 2/2 cellulits #tte c/f veg, tee negative - Pt was in the ED on 6/10 due to leg cellulitis.  Discharged on Keflex  and doxycycline  x10 days.  Blood cultures returned from 6/10 1/2 strep. - Patient was called to come back to the ED.  He did not present again to the ED - Seen by internal medicine, he was then admitted for TEE - TEE showed no vegetation, referred to infectious disease 6/19 blood Cx ng , wbc 8.6k. he was given addiotnal 6 days of keflex .  -Linezolid  x 2 weeks completed -Blood Cx today off of abx -F/U with PRN   Loney Stank, MD [facility]   12/21/23  12:20 PM I have personally spent 35 minutes involved in face-to-face and non-face-to-face activities for this patient on the day of the visit. Professional time spent includes the following activities: Preparing to see the patient (review of tests), Obtaining and/or reviewing separately obtained history (admission/discharge record), Performing a medically appropriate examination and/or evaluation , Ordering medications/tests/procedures, referring and communicating with other health care professionals, Documenting clinical information in the EMR, Independently interpreting results (not separately  reported), Communicating results to the patient/family/caregiver, Counseling and educating the patient/family/caregiver and Care coordination (not separately reported).

## 2023-12-27 LAB — CULTURE, BLOOD (SINGLE)
MICRO NUMBER:: 16761067
MICRO NUMBER:: 16761068
Result:: NO GROWTH
Result:: NO GROWTH
SPECIMEN QUALITY:: ADEQUATE
SPECIMEN QUALITY:: ADEQUATE
